# Patient Record
Sex: Male | Born: 1980
Health system: Southern US, Community
[De-identification: ages and names within clinical notes are randomized; demographics above are authoritative.]

## PROBLEM LIST (undated history)

## (undated) DIAGNOSIS — R85619 Unspecified abnormal cytological findings in specimens from anus: Secondary | ICD-10-CM

## (undated) DIAGNOSIS — J301 Allergic rhinitis due to pollen: Secondary | ICD-10-CM

## (undated) DIAGNOSIS — F1910 Other psychoactive substance abuse, uncomplicated: Secondary | ICD-10-CM

## (undated) DIAGNOSIS — F329 Major depressive disorder, single episode, unspecified: Secondary | ICD-10-CM

## (undated) DIAGNOSIS — B20 Human immunodeficiency virus [HIV] disease: Secondary | ICD-10-CM

## (undated) DIAGNOSIS — F32A Depression, unspecified: Secondary | ICD-10-CM

## (undated) HISTORY — DX: Human immunodeficiency virus (HIV) disease: B20

## (undated) HISTORY — PX: NO PAST SURGERIES: SHX2092

---

## 2012-02-18 ENCOUNTER — Emergency Department (HOSPITAL_COMMUNITY): Payer: Self-pay

## 2012-02-18 ENCOUNTER — Inpatient Hospital Stay (HOSPITAL_COMMUNITY)
Admission: EM | Admit: 2012-02-18 | Discharge: 2012-02-24 | DRG: 975 | Disposition: A | Discharge status: Home / Self Care | Payer: MEDICAID | Attending: Internal Medicine | Admitting: Internal Medicine

## 2012-02-18 ENCOUNTER — Encounter (HOSPITAL_COMMUNITY): Payer: Self-pay | Admitting: Emergency Medicine

## 2012-02-18 DIAGNOSIS — E871 Hypo-osmolality and hyponatremia: Secondary | ICD-10-CM | POA: Diagnosis present

## 2012-02-18 DIAGNOSIS — R634 Abnormal weight loss: Secondary | ICD-10-CM | POA: Diagnosis present

## 2012-02-18 DIAGNOSIS — B59 Pneumocystosis: Secondary | ICD-10-CM | POA: Diagnosis present

## 2012-02-18 DIAGNOSIS — D6489 Other specified anemias: Secondary | ICD-10-CM | POA: Diagnosis present

## 2012-02-18 DIAGNOSIS — F329 Major depressive disorder, single episode, unspecified: Secondary | ICD-10-CM | POA: Diagnosis present

## 2012-02-18 DIAGNOSIS — Z681 Body mass index (BMI) 19 or less, adult: Secondary | ICD-10-CM

## 2012-02-18 DIAGNOSIS — B370 Candidal stomatitis: Secondary | ICD-10-CM | POA: Diagnosis present

## 2012-02-18 DIAGNOSIS — F1211 Cannabis abuse, in remission: Secondary | ICD-10-CM | POA: Diagnosis present

## 2012-02-18 DIAGNOSIS — J189 Pneumonia, unspecified organism: Secondary | ICD-10-CM

## 2012-02-18 DIAGNOSIS — M25569 Pain in unspecified knee: Secondary | ICD-10-CM | POA: Diagnosis present

## 2012-02-18 DIAGNOSIS — R61 Generalized hyperhidrosis: Secondary | ICD-10-CM | POA: Diagnosis present

## 2012-02-18 DIAGNOSIS — F17200 Nicotine dependence, unspecified, uncomplicated: Secondary | ICD-10-CM | POA: Diagnosis present

## 2012-02-18 DIAGNOSIS — D649 Anemia, unspecified: Secondary | ICD-10-CM

## 2012-02-18 DIAGNOSIS — R509 Fever, unspecified: Secondary | ICD-10-CM

## 2012-02-18 DIAGNOSIS — R109 Unspecified abdominal pain: Secondary | ICD-10-CM | POA: Diagnosis present

## 2012-02-18 DIAGNOSIS — M25559 Pain in unspecified hip: Secondary | ICD-10-CM | POA: Diagnosis present

## 2012-02-18 DIAGNOSIS — F17210 Nicotine dependence, cigarettes, uncomplicated: Secondary | ICD-10-CM

## 2012-02-18 DIAGNOSIS — F1411 Cocaine abuse, in remission: Secondary | ICD-10-CM | POA: Diagnosis present

## 2012-02-18 DIAGNOSIS — Y9289 Other specified places as the place of occurrence of the external cause: Secondary | ICD-10-CM

## 2012-02-18 DIAGNOSIS — B20 Human immunodeficiency virus [HIV] disease: Principal | ICD-10-CM | POA: Diagnosis present

## 2012-02-18 DIAGNOSIS — W208XXA Other cause of strike by thrown, projected or falling object, initial encounter: Secondary | ICD-10-CM | POA: Diagnosis present

## 2012-02-18 DIAGNOSIS — L989 Disorder of the skin and subcutaneous tissue, unspecified: Secondary | ICD-10-CM | POA: Diagnosis present

## 2012-02-18 DIAGNOSIS — F3289 Other specified depressive episodes: Secondary | ICD-10-CM | POA: Diagnosis present

## 2012-02-18 HISTORY — DX: Depression, unspecified: F32.A

## 2012-02-18 HISTORY — DX: Allergic rhinitis due to pollen: J30.1

## 2012-02-18 HISTORY — DX: Major depressive disorder, single episode, unspecified: F32.9

## 2012-02-18 LAB — CBC WITH DIFFERENTIAL/PLATELET
Basophils Absolute: 0 10*3/uL (ref 0.0–0.1)
Eosinophils Relative: 1 % (ref 0–5)
Lymphs Abs: 0.7 10*3/uL (ref 0.7–4.0)
Monocytes Absolute: 1 10*3/uL (ref 0.1–1.0)
Monocytes Relative: 11 % (ref 3–12)
Neutrophils Relative %: 80 % — ABNORMAL HIGH (ref 43–77)
Platelets: 274 10*3/uL (ref 150–400)
RBC: 2.97 MIL/uL — ABNORMAL LOW (ref 4.22–5.81)
RDW: 16.4 % — ABNORMAL HIGH (ref 11.5–15.5)
WBC: 8.8 10*3/uL (ref 4.0–10.5)

## 2012-02-18 LAB — URINALYSIS, ROUTINE W REFLEX MICROSCOPIC
Bilirubin Urine: NEGATIVE
Glucose, UA: NEGATIVE mg/dL
Hgb urine dipstick: NEGATIVE
Specific Gravity, Urine: 1.016 (ref 1.005–1.030)

## 2012-02-18 LAB — COMPREHENSIVE METABOLIC PANEL
ALT: 13 U/L (ref 0–53)
AST: 20 U/L (ref 0–37)
Albumin: 2.7 g/dL — ABNORMAL LOW (ref 3.5–5.2)
CO2: 26 mEq/L (ref 19–32)
Calcium: 8.6 mg/dL (ref 8.4–10.5)
Chloride: 91 mEq/L — ABNORMAL LOW (ref 96–112)
Creatinine, Ser: 0.81 mg/dL (ref 0.50–1.35)
GFR calc non Af Amer: >90 mL/min (ref >90)
Sodium: 125 mEq/L — ABNORMAL LOW (ref 135–145)

## 2012-02-18 MED ORDER — ZOLPIDEM TARTRATE 5 MG PO TABS
5.0000 mg | ORAL_TABLET | Freq: Every evening | ORAL | Status: DC | PRN
  Administered 2012-02-19 – 2012-02-22 (×5): 5 mg via ORAL
  Filled 2012-02-18 (×5): qty 1

## 2012-02-18 MED ORDER — DEXTROSE 5 % IV SOLN
500.0000 mg | INTRAVENOUS | Status: DC
  Administered 2012-02-19 – 2012-02-22 (×4): 500 mg via INTRAVENOUS
  Filled 2012-02-18 (×7): qty 500

## 2012-02-18 MED ORDER — ALUM & MAG HYDROXIDE-SIMETH 200-200-20 MG/5ML PO SUSP
30.0000 mL | Freq: Four times a day (QID) | ORAL | Status: DC | PRN
  Administered 2012-02-22: 30 mL via ORAL
  Filled 2012-02-18: qty 30

## 2012-02-18 MED ORDER — DEXTROSE 5 % IV SOLN
1.0000 g | Freq: Once | INTRAVENOUS | Status: AC
  Administered 2012-02-18: 1 g via INTRAVENOUS
  Filled 2012-02-18: qty 10

## 2012-02-18 MED ORDER — ALBUTEROL SULFATE (5 MG/ML) 0.5% IN NEBU
2.5000 mg | INHALATION_SOLUTION | RESPIRATORY_TRACT | Status: DC | PRN
  Administered 2012-02-21: 2.5 mg via RESPIRATORY_TRACT
  Filled 2012-02-18: qty 0.5

## 2012-02-18 MED ORDER — SODIUM CHLORIDE 0.9 % IV SOLN
INTRAVENOUS | Status: DC
  Administered 2012-02-19 (×2): via INTRAVENOUS
  Administered 2012-02-20: 75 mL/h via INTRAVENOUS
  Administered 2012-02-22: 15:00:00 via INTRAVENOUS

## 2012-02-18 MED ORDER — ENOXAPARIN SODIUM 40 MG/0.4ML ~~LOC~~ SOLN
40.0000 mg | SUBCUTANEOUS | Status: DC
  Administered 2012-02-19 – 2012-02-21 (×2): 40 mg via SUBCUTANEOUS
  Filled 2012-02-18 (×7): qty 0.4

## 2012-02-18 MED ORDER — OXYCODONE HCL 5 MG PO TABS
5.0000 mg | ORAL_TABLET | ORAL | Status: DC | PRN
  Administered 2012-02-19: 5 mg via ORAL
  Filled 2012-02-18: qty 1

## 2012-02-18 MED ORDER — ONDANSETRON HCL 4 MG/2ML IJ SOLN: 4.0000 mg | Freq: Four times a day (QID) | INTRAMUSCULAR | Status: DC | PRN

## 2012-02-18 MED ORDER — ONDANSETRON HCL 4 MG PO TABS: 4.0000 mg | ORAL_TABLET | Freq: Four times a day (QID) | ORAL | Status: DC | PRN

## 2012-02-18 MED ORDER — ACETAMINOPHEN 325 MG PO TABS
650.0000 mg | ORAL_TABLET | Freq: Four times a day (QID) | ORAL | Status: DC | PRN
  Administered 2012-02-21: 650 mg via ORAL

## 2012-02-18 MED ORDER — ACETAMINOPHEN 650 MG RE SUPP: 650.0000 mg | Freq: Four times a day (QID) | RECTAL | Status: DC | PRN

## 2012-02-18 MED ORDER — ONDANSETRON HCL 4 MG/2ML IJ SOLN
4.0000 mg | Freq: Once | INTRAMUSCULAR | Status: AC
  Administered 2012-02-18: 4 mg via INTRAVENOUS
  Filled 2012-02-18: qty 2

## 2012-02-18 MED ORDER — DEXTROSE 5 % IV SOLN
500.0000 mg | Freq: Once | INTRAVENOUS | Status: AC
  Administered 2012-02-18: 500 mg via INTRAVENOUS
  Filled 2012-02-18 (×2): qty 500

## 2012-02-18 MED ORDER — HYDROMORPHONE HCL PF 1 MG/ML IJ SOLN
1.0000 mg | Freq: Once | INTRAMUSCULAR | Status: AC
  Administered 2012-02-18: 1 mg via INTRAVENOUS
  Filled 2012-02-18: qty 1

## 2012-02-18 MED ORDER — ALBUTEROL SULFATE (5 MG/ML) 0.5% IN NEBU
2.5000 mg | INHALATION_SOLUTION | Freq: Four times a day (QID) | RESPIRATORY_TRACT | Status: DC
  Administered 2012-02-19 (×4): 2.5 mg via RESPIRATORY_TRACT
  Filled 2012-02-18 (×4): qty 0.5

## 2012-02-18 MED ORDER — HYDROMORPHONE HCL PF 1 MG/ML IJ SOLN
0.5000 mg | INTRAMUSCULAR | Status: DC | PRN
  Administered 2012-02-19: 0.5 mg via INTRAVENOUS
  Administered 2012-02-21: 1 mg via INTRAVENOUS
  Filled 2012-02-18 (×2): qty 1

## 2012-02-18 MED ORDER — DEXTROSE 5 % IV SOLN
1.0000 g | INTRAVENOUS | Status: DC
  Administered 2012-02-19 – 2012-02-23 (×5): 1 g via INTRAVENOUS
  Filled 2012-02-18 (×6): qty 10

## 2012-02-18 MED ORDER — ACETAMINOPHEN 325 MG PO TABS
650.0000 mg | ORAL_TABLET | Freq: Four times a day (QID) | ORAL | Status: DC | PRN
  Administered 2012-02-18 – 2012-02-20 (×3): 650 mg via ORAL
  Filled 2012-02-18 (×4): qty 2

## 2012-02-18 MED ORDER — IBUPROFEN 400 MG PO TABS
600.0000 mg | ORAL_TABLET | Freq: Once | ORAL | Status: AC
  Administered 2012-02-18: 600 mg via ORAL
  Filled 2012-02-18 (×2): qty 1

## 2012-02-18 NOTE — ED Notes (Signed)
Pt has interpreter at bedside; reports that pt had injury at work on Monday, c/o R knee, hip, and L shoulder/rib pain with movement; pt also reports having a fever but unsure why, reports SOB but states it started after injury

## 2012-02-18 NOTE — H&P (Signed)
[facility] History and Physical  Herbert Ellison ZOX:[id number] DOB: 1980-05-23 DOA: 02/18/2012  Referring physician:  PCP: No primary provider on file.  Specialists:   Chief Complaint: Fevers and Cough  HPI: Herbert Ellison is a 30 y.o. male from [place] who has been brought to [facility] by friends of his family after the loss of his mother.  He was brought to the ED due to fevers chills and cough for the past 4 days.  He has had fevers to 103.  He speaks very little [place], and his family friends are at the bedside and interpret.    In the ED he was evaluated by the EDP, and a chest x-Ray was done which revealed bilateral pneumonia.  A rapid HIV test was also ordered.  He was placed on IV Rocephin and Azithromycin to cover Community Acquired Pneumonia and referred for medical admission.      Review of Systems: The patient denies anorexia, fever, weight loss, vision loss, decreased hearing, hoarseness, chest pain, syncope, dyspnea on exertion, peripheral edema, balance deficits, hemoptysis, abdominal pain, melena, hematochezia, severe indigestion/heartburn, hematuria, incontinence, genital sores, muscle weakness, suspicious skin lesions, transient blindness, difficulty walking, depression, unusual weight change, abnormal bleeding, enlarged lymph nodes, angioedema, and breast masses.    Past Medical History  Diagnosis Date  . Hay fever     when child  . Depression      History reviewed. No pertinent past surgical history.     Medications:  HOME MEDS: Prior to Admission medications   Medication Sig Start Date End Date Taking? Authorizing Provider  acetaminophen (TYLENOL) 500 MG tablet Take 1,000 mg by mouth every 6 (six) hours as needed. For pain   Yes Historical Provider, MD    Allergies:  No Known Allergies  Social History:   reports that he has been smoking Cigarettes.  He has been smoking about 1 pack per day. He does not have any smokeless  tobacco history on file. He reports that he does not drink alcohol or use illicit drugs.   Family History:          Mother deceased of CHF       Maternal Uncle with Diabetes   Physical Exam:  GEN:  Pleasant 31 year old thin Hispanic Male examined  and in no acute distress; cooperative with exam Filed Vitals:   02/18/12 1929 02/18/12 2129 02/18/12 2130 02/18/12 2253  BP: 102/58 91/50 103/65   Pulse: 141 133 115   Temp: 103 F (39.4 C) 102.5 F (39.2 C)  99 F (37.2 C)  TempSrc: Oral Oral  Oral  Resp: 24 26 22    SpO2: 97% 98% 100%    Blood pressure 103/65, pulse 115, temperature 99 F (37.2 C), temperature source Oral, resp. rate 22, SpO2 100.00%. PSYCH: He is alert and oriented x4; does not appear anxious does not appear depressed; affect is normal HEENT: Normocephalic and Atraumatic, Mucous membranes pink; PERRLA; EOM intact; Fundi:  Benign;  No scleral icterus, Nares: Patent, Oropharynx: + white tongue exudate, Fair Dentition, Neck:  FROM, no cervical lymphadenopathy nor thyromegaly or carotid bruit; no JVD; Breasts:: Not examined CHEST WALL: No tenderness CHEST: Normal respiration, clear to auscultation bilaterally HEART: Regular rate and rhythm; no murmurs rubs or gallops BACK: No kyphosis or scoliosis; no CVA tenderness ABDOMEN: Positive Bowel Sounds, Scaphoid, soft non-tender; no masses, no organomegaly.   Rectal Exam: Not done EXTREMITIES: No bone or joint deformity; age-appropriate arthropathy of the hands and knees; no cyanosis, clubbing or  edema; no ulcerations. Genitalia: not examined PULSES: 2+ and symmetric SKIN: Diffuse scaling and papules on extremities.   CNS: Cranial nerves 2-12 grossly intact no focal neurologic deficit    Labs on Admission:  Basic Metabolic Panel:  Lab 02/18/12 4098  NA 125*  K 4.0  CL 91*  CO2 26  GLUCOSE 106*  BUN 8  CREATININE 0.81  CALCIUM 8.6  MG --  PHOS --   Liver Function Tests:  Lab 02/18/12 1959  AST 20  ALT 13    ALKPHOS 49  BILITOT 0.2*  PROT 8.4*  ALBUMIN 2.7*   No results found for this basename: LIPASE:5,AMYLASE:5 in the last 168 hours No results found for this basename: AMMONIA:5 in the last 168 hours CBC:  Lab 02/18/12 1959  WBC 8.8  NEUTROABS 7.0  HGB 8.0*  HCT 24.5*  MCV 82.5  PLT 274   Cardiac Enzymes: No results found for this basename: CKTOTAL:5,CKMB:5,CKMBINDEX:5,TROPONINI:5 in the last 168 hours  BNP (last 3 results) No results found for this basename: PROBNP:3 in the last 8760 hours CBG: No results found for this basename: GLUCAP:5 in the last 168 hours  Radiological Exams on Admission: Dg Chest 2 View  02/18/2012  *RADIOLOGY REPORT*  Clinical Data: Fever and chills.  CHEST - 2 VIEW  Comparison: None.  Findings: There is evidence of multifocal pneumonia with discrete infiltrates present in the right upper lobe and the lingula.  There may also be involvement of the left lower lobe.  There is an associated small right pleural effusion.  No pulmonary edema.  The heart size is normal.  IMPRESSION: Bilateral pneumonia in the right upper lobe and lingula.  There may also be infiltrate in the left lower lobe.  Associated small right pleural effusion is present.   Original Report Authenticated By: Irish Lack, M.D.    Dg Hip Complete Right  02/18/2012  *RADIOLOGY REPORT*  Clinical Data: Pain post blunt trauma.  RIGHT HIP - COMPLETE 2+ VIEW  Comparison: None.  Findings: Negative for fracture, dislocation, or other acute abnormality.  Normal alignment and mineralization. No significant degenerative change.  Regional soft tissues unremarkable.  IMPRESSION:  Negative   Original Report Authenticated By: D. Andria Rhein, MD    Dg Knee Complete 4 Views Right  02/18/2012  *RADIOLOGY REPORT*  Clinical Data: Pain post blunt trauma  RIGHT KNEE - COMPLETE 4+ VIEW  Comparison: None.  Findings: No effusion. Negative for fracture, dislocation, or other acute abnormality.  Normal alignment and  mineralization. No significant degenerative change.  Regional soft tissues unremarkable.  IMPRESSION:  Negative   Original Report Authenticated By: D. Andria Rhein, MD     EKG: Independently reviewed.   Assessment: Principal Problem:  *Community acquired pneumonia Active Problems:  Hyponatremia  Anemia  Fever  Oral candidiasis New Diagnosis of HIV     Plan: Admit to Med/Surg Bed IV Rocephin and Azithromycin Albuterol Nebulizer treatments PRN O2 IV Fluconazole  DVT prophylaxis Addendum:  The Rapid HIV Test Returned Reactive,  And Patient was placed on IV Bactrim to cover PCP pneumonia, and a CD4 count was ordered Patient Needs an Infectious Diseases Consultation in the AM.      Code Status: FULL CODE Family Communication: His Uncle's Mother and Uncle's Brother are at Bedside Disposition Plan: Return to Home  Time spent:60 Minutes Ron Parker [facility] Pager [phone]  If 7PM-7AM, please contact night-coverage [email or web address] Password [facility] 02/18/2012, 11:16 PM

## 2012-02-18 NOTE — ED Provider Notes (Signed)
History     CSN: [id number]  Arrival date & time 02/18/12  1918   First MD Initiated Contact with Patient 02/18/12 2059      Chief Complaint  Patient presents with  . Fever  . Injury    (Consider location/radiation/quality/duration/timing/severity/associated sxs/prior treatment) Patient is a 31 y.o. male presenting with fever and injury. The history is provided by the patient.  Fever Primary symptoms of the febrile illness include fever, cough, shortness of breath, nausea, vomiting and myalgias. Primary symptoms do not include wheezing, abdominal pain, diarrhea or dysuria. The current episode started 3 to 5 days ago. This is a new problem. The problem has been rapidly worsening.  The maximum temperature recorded prior to his arrival was 103 to 104 F. The temperature was taken by an oral thermometer.  Risk factors for febrile illness include implanted device. Injury  Episode onset: 4 days ago while at work. The incident occurred at work. Injury mechanism: was lifting a heavy couch and it fell on his knee. The injury was related to work. There is an injury to the right hip and right knee. The pain is severe. Associated symptoms include nausea, vomiting and cough. Pertinent negatives include no abdominal pain. He has received no recent medical care.    Past Medical History  Diagnosis Date  . Hay fever     when child  . Depression     History reviewed. No pertinent past surgical history.  History reviewed. No pertinent family history.  History  Substance Use Topics  . Smoking status: Current Every Day Smoker -- 1.0 packs/day    Types: Cigarettes  . Smokeless tobacco: Not on file  . Alcohol Use: No      Review of Systems  Constitutional: Positive for fever.  Respiratory: Positive for cough and shortness of breath. Negative for wheezing.   Gastrointestinal: Positive for nausea and vomiting. Negative for abdominal pain and diarrhea.  Genitourinary: Negative for dysuria.    Musculoskeletal: Positive for myalgias.  All other systems reviewed and are negative.    Allergies  Review of patient's allergies indicates no known allergies.  Home Medications   Current Outpatient Rx  Name  Route  Sig  Dispense  Refill  . ACETAMINOPHEN 500 MG PO TABS   Oral   Take 1,000 mg by mouth every 6 (six) hours as needed. For pain           BP 103/65  Pulse 115  Temp 102.5 F (39.2 C) (Oral)  Resp 22  SpO2 100%  Physical Exam  Nursing note and vitals reviewed. Constitutional: He is oriented to person, place, and time. He appears well-developed and well-nourished. No distress.  HENT:  Head: Normocephalic and atraumatic.  Mouth/Throat: Oropharynx is clear and moist.  Eyes: Conjunctivae normal and EOM are normal. Pupils are equal, round, and reactive to light.  Neck: Normal range of motion. Neck supple.  Cardiovascular: Regular rhythm and intact distal pulses.  Tachycardia present.   No murmur heard. Pulmonary/Chest: Tachypnea noted. No respiratory distress. He has no wheezes. He has rhonchi. He has no rales. He exhibits tenderness.  Abdominal: Soft. He exhibits no distension. There is no tenderness. There is no rebound and no guarding. A hernia is present. Hernia confirmed negative in the right inguinal area and confirmed negative in the left inguinal area.  Musculoskeletal: Normal range of motion. He exhibits no edema and no tenderness.       Right hip: He exhibits bony tenderness.  Right knee: He exhibits normal range of motion and no swelling. tenderness found. Medial joint line and lateral joint line tenderness noted.  Neurological: He is alert and oriented to person, place, and time.  Skin: Skin is warm and dry. No rash noted. No erythema.  Psychiatric: He has a normal mood and affect. His behavior is normal.    ED Course  Procedures (including critical care time)  Labs Reviewed  CBC WITH DIFFERENTIAL - Abnormal; Notable for the following:    RBC  2.97 (*)     Hemoglobin 8.0 (*)     HCT 24.5 (*)     RDW 16.4 (*)     Neutrophils Relative 80 (*)     Lymphocytes Relative 8 (*)     All other components within normal limits  COMPREHENSIVE METABOLIC PANEL - Abnormal; Notable for the following:    Sodium 125 (*)     Chloride 91 (*)     Glucose, Bld 106 (*)     Total Protein 8.4 (*)     Albumin 2.7 (*)     Total Bilirubin 0.2 (*)     All other components within normal limits  URINALYSIS, ROUTINE W REFLEX MICROSCOPIC - Abnormal; Notable for the following:    Protein, ur 100 (*)     All other components within normal limits  URINE MICROSCOPIC-ADD ON  URINE CULTURE  RAPID HIV SCREEN ([facility])  HEPATIC FUNCTION PANEL  LEGIONELLA ANTIGEN, URINE   Dg Chest 2 View  02/18/2012  *RADIOLOGY REPORT*  Clinical Data: Fever and chills.  CHEST - 2 VIEW  Comparison: None.  Findings: There is evidence of multifocal pneumonia with discrete infiltrates present in the right upper lobe and the lingula.  There may also be involvement of the left lower lobe.  There is an associated small right pleural effusion.  No pulmonary edema.  The heart size is normal.  IMPRESSION: Bilateral pneumonia in the right upper lobe and lingula.  There may also be infiltrate in the left lower lobe.  Associated small right pleural effusion is present.   Original Report Authenticated By: Irish Lack, M.D.      No diagnosis found.    MDM   Patient with infectious symptoms for the last 4 days. Chest pain, shortness of breath, fever, cough. His chest x-ray significant for bilateral patchy pneumonia. Also patient has a hemoglobin of 8 and a sodium of 125. He is only 31 years old and has no prior medical history. Concern for possible HIV versus legionnaires disease or other underlying etiology as the cause of his abnormal labs. Patient was given IV fluids, antipyretics, Rocephin and azithromycin. He will be admitted for further evaluation.  Also he continues to state that  his knee right knee and hip her to do to an accident at work. These areas were x-rayed.        Gwyneth Sprout, MD 02/18/12 2235

## 2012-02-19 ENCOUNTER — Encounter (HOSPITAL_COMMUNITY): Payer: Self-pay | Admitting: *Deleted

## 2012-02-19 ENCOUNTER — Observation Stay (HOSPITAL_COMMUNITY): Payer: Self-pay

## 2012-02-19 DIAGNOSIS — F17210 Nicotine dependence, cigarettes, uncomplicated: Secondary | ICD-10-CM | POA: Diagnosis present

## 2012-02-19 DIAGNOSIS — J189 Pneumonia, unspecified organism: Secondary | ICD-10-CM

## 2012-02-19 LAB — CBC
Hemoglobin: 8.5 g/dL — ABNORMAL LOW (ref 13.0–17.0)
MCH: 26.6 pg (ref 26.0–34.0)
RBC: 3.19 MIL/uL — ABNORMAL LOW (ref 4.22–5.81)
WBC: 7 10*3/uL (ref 4.0–10.5)

## 2012-02-19 LAB — HEPATIC FUNCTION PANEL
ALT: 12 U/L (ref 0–53)
Alkaline Phosphatase: 42 U/L (ref 39–117)
Bilirubin, Direct: <0.1 mg/dL (ref 0.0–0.3)
Total Bilirubin: 0.2 mg/dL — ABNORMAL LOW (ref 0.3–1.2)

## 2012-02-19 LAB — BASIC METABOLIC PANEL
CO2: 24 mEq/L (ref 19–32)
GFR calc non Af Amer: >90 mL/min (ref >90)
Glucose, Bld: 115 mg/dL — ABNORMAL HIGH (ref 70–99)
Potassium: 3.3 mEq/L — ABNORMAL LOW (ref 3.5–5.1)
Sodium: 134 mEq/L — ABNORMAL LOW (ref 135–145)

## 2012-02-19 LAB — RAPID URINE DRUG SCREEN, HOSP PERFORMED
Amphetamines: NOT DETECTED
Opiates: NOT DETECTED

## 2012-02-19 MED ORDER — WHITE PETROLATUM GEL
Status: AC
  Administered 2012-02-19: 19:00:00
  Filled 2012-02-19: qty 5

## 2012-02-19 MED ORDER — BIOTENE DRY MOUTH MT LIQD
15.0000 mL | Freq: Two times a day (BID) | OROMUCOSAL | Status: DC
  Administered 2012-02-19 – 2012-02-24 (×10): 15 mL via OROMUCOSAL

## 2012-02-19 MED ORDER — OXYMETAZOLINE HCL 0.05 % NA SOLN: 1.0000 | Freq: Two times a day (BID) | NASAL | Status: DC

## 2012-02-19 MED ORDER — OXYMETAZOLINE HCL 0.05 % NA SOLN
1.0000 | Freq: Two times a day (BID) | NASAL | Status: DC | PRN
  Administered 2012-02-19: 1 via NASAL
  Filled 2012-02-19: qty 15

## 2012-02-19 MED ORDER — SULFAMETHOXAZOLE-TRIMETHOPRIM 400-80 MG/5ML IV SOLN
250.0000 mg | INTRAVENOUS | Status: AC
  Administered 2012-02-19: 250 mg via INTRAVENOUS
  Filled 2012-02-19: qty 15.6

## 2012-02-19 MED ORDER — OXYCODONE-ACETAMINOPHEN 5-325 MG PO TABS
1.0000 | ORAL_TABLET | Freq: Four times a day (QID) | ORAL | Status: DC | PRN
  Administered 2012-02-19: 1 via ORAL
  Filled 2012-02-19: qty 1

## 2012-02-19 MED ORDER — NICOTINE 14 MG/24HR TD PT24
14.0000 mg | MEDICATED_PATCH | TRANSDERMAL | Status: DC
  Administered 2012-02-19 – 2012-02-24 (×6): 14 mg via TRANSDERMAL
  Filled 2012-02-19 (×6): qty 1

## 2012-02-19 MED ORDER — SULFAMETHOXAZOLE-TRIMETHOPRIM 400-80 MG/5ML IV SOLN
250.0000 mg | Freq: Three times a day (TID) | INTRAVENOUS | Status: DC
  Administered 2012-02-19 – 2012-02-23 (×12): 250 mg via INTRAVENOUS
  Filled 2012-02-19 (×27): qty 15.6

## 2012-02-19 MED ORDER — FLUCONAZOLE 100MG IVPB
100.0000 mg | INTRAVENOUS | Status: DC
  Administered 2012-02-19 – 2012-02-20 (×2): 100 mg via INTRAVENOUS
  Filled 2012-02-19 (×3): qty 50

## 2012-02-19 MED ORDER — FLUCONAZOLE IN SODIUM CHLORIDE 200-0.9 MG/100ML-% IV SOLN
200.0000 mg | Freq: Once | INTRAVENOUS | Status: AC
  Administered 2012-02-19: 200 mg via INTRAVENOUS
  Filled 2012-02-19: qty 100

## 2012-02-19 MED ORDER — SODIUM CHLORIDE 0.9 % IV BOLUS (SEPSIS)
1000.0000 mL | Freq: Once | INTRAVENOUS | Status: AC
  Administered 2012-02-19: 1000 mL via INTRAVENOUS

## 2012-02-19 MED ORDER — OXYCODONE-ACETAMINOPHEN 5-325 MG PO TABS
1.0000 | ORAL_TABLET | Freq: Four times a day (QID) | ORAL | Status: DC | PRN
  Administered 2012-02-19: 2 via ORAL
  Administered 2012-02-21 – 2012-02-22 (×2): 1 via ORAL
  Administered 2012-02-22: 2 via ORAL
  Administered 2012-02-22: 1 via ORAL
  Administered 2012-02-23 (×3): 2 via ORAL
  Filled 2012-02-19 (×3): qty 2
  Filled 2012-02-19: qty 1
  Filled 2012-02-19 (×2): qty 2
  Filled 2012-02-19 (×2): qty 1

## 2012-02-19 NOTE — Progress Notes (Signed)
[facility]             Progress Note   Subjective: Seen with NT who help as interpreter, reports feeling a little better than yesterday, breathing improving, having chills. Moved from [place] in Early November, lives with Herbert Ellison and family, had an accident where a cough fell on his R knee and hip on Monday and that night started having cough, shortness of breath and pleuritic chest pain  Objective: Vital signs in last 24 hours: Temp:  [97.2 F (36.2 C)-103 F (39.4 C)] 97.5 F (36.4 C) (11/23 0851) Pulse Rate:  [82-141] 92  (11/23 0851) Resp:  [18-26] 18  (11/23 0851) BP: (77-103)/(42-65) 85/49 mmHg (11/23 0851) SpO2:  [96 %-100 %] 98 % (11/23 0736) Weight:  [49.7 kg (109 lb 9.1 oz)] 49.7 kg (109 lb 9.1 oz) (11/23 0020) Weight change:  Last BM Date: 02/18/12  Intake/Output from previous day: 11/22 0701 - 11/23 0700 In: 1574 [P.O.:420; I.V.:538.3; IV Piggyback:615.6] Out: 1900 [Urine:1900] Total I/O In: 1050 [IV Piggyback:1050] Out: 1000 [Urine:1000]   Physical Exam: General: Alert, awake, oriented x3, in no acute distress. HEENT: No bruits, no goiter, whitish exudates on tongue Heart: Regular rate and rhythm, without murmurs, rubs, gallops. Lungs: scattered ronchi Abdomen: Soft, nontender, nondistended, positive bowel sounds. Extremities: No clubbing cyanosis or edema with positive pedal pulses. Neuro: Grossly intact, nonfocal. Skin: diffuse scaling and hypopigmented plaques all over    Lab Results: Basic Metabolic Panel:  [facility] 02/19/12 0605 02/18/12 1959  NA 134* 125*  K 3.3* 4.0  CL 99 91*  CO2 24 26  GLUCOSE 115* 106*  BUN 8 8  CREATININE 0.63 0.81  CALCIUM 8.3* 8.6  MG -- --  PHOS -- --   Liver Function Tests:  [facility] 02/18/12 2307 02/18/12 1959  AST 17 20  ALT 12 13  ALKPHOS 42 49  BILITOT 0.2* 0.2*  PROT 7.3 8.4*  ALBUMIN 2.3* 2.7*   No results found for this basename: LIPASE:2,AMYLASE:2 in the last 72 hours No  results found for this basename: AMMONIA:2 in the last 72 hours CBC:  Basename 02/19/12 0605 02/18/12 1959  WBC 7.0 8.8  NEUTROABS -- 7.0  HGB 8.5* 8.0*  HCT 26.5* 24.5*  MCV 83.1 82.5  PLT 228 274   Cardiac Enzymes: No results found for this basename: CKTOTAL:3,CKMB:3,CKMBINDEX:3,TROPONINI:3 in the last 72 hours BNP: No results found for this basename: PROBNP:3 in the last 72 hours D-Dimer: No results found for this basename: DDIMER:2 in the last 72 hours CBG: No results found for this basename: GLUCAP:6 in the last 72 hours Hemoglobin A1C: No results found for this basename: HGBA1C in the last 72 hours Fasting Lipid Panel: No results found for this basename: CHOL,HDL,LDLCALC,TRIG,CHOLHDL,LDLDIRECT in the last 72 hours Thyroid Function Tests: No results found for this basename: TSH,T4TOTAL,FREET4,T3FREE,THYROIDAB in the last 72 hours Anemia Panel: No results found for this basename: VITAMINB12,FOLATE,FERRITIN,TIBC,IRON,RETICCTPCT in the last 72 hours Coagulation: No results found for this basename: LABPROT:2,INR:2 in the last 72 hours Urine Drug Screen: Drugs of Abuse     Component Value Date/Time   LABOPIA NONE DETECTED 02/19/2012 0907   COCAINSCRNUR NONE DETECTED 02/19/2012 0907   LABBENZ NONE DETECTED 02/19/2012 0907   AMPHETMU NONE DETECTED 02/19/2012 0907   THCU POSITIVE* 02/19/2012 0907   LABBARB NONE DETECTED 02/19/2012 0907    Alcohol Level: No results found for this basename: ETH:2 in the last 72 hours Urinalysis:  Basename 02/18/12 1956  COLORURINE YELLOW  LABSPEC 1.016  PHURINE 6.5  GLUCOSEU NEGATIVE  HGBUR NEGATIVE  BILIRUBINUR NEGATIVE  KETONESUR NEGATIVE  PROTEINUR 100*  UROBILINOGEN 1.0  NITRITE NEGATIVE  LEUKOCYTESUR NEGATIVE    No results found for this or any previous visit (from the past 240 hour(s)).  Studies/Results: Dg Chest 2 View  02/18/2012  *RADIOLOGY REPORT*  Clinical Data: Fever and chills.  CHEST - 2 VIEW  Comparison: None.   Findings: There is evidence of multifocal pneumonia with discrete infiltrates present in the right upper lobe and the lingula.  There may also be involvement of the left lower lobe.  There is an associated small right pleural effusion.  No pulmonary edema.  The heart size is normal.  IMPRESSION: Bilateral pneumonia in the right upper lobe and lingula.  There may also be infiltrate in the left lower lobe.  Associated small right pleural effusion is present.   Original Report Authenticated By: Irish Lack, M.D.    Dg Hip Complete Right  02/18/2012  *RADIOLOGY REPORT*  Clinical Data: Pain post blunt trauma.  RIGHT HIP - COMPLETE 2+ VIEW  Comparison: None.  Findings: Negative for fracture, dislocation, or other acute abnormality.  Normal alignment and mineralization. No significant degenerative change.  Regional soft tissues unremarkable.  IMPRESSION:  Negative   Original Report Authenticated By: D. Andria Rhein, MD    Ct Head Wo Contrast  02/19/2012  *RADIOLOGY REPORT*  Clinical Data:  Injury from fall with frontal headache and posterior neck pain.  CT HEAD WITHOUT CONTRAST CT CERVICAL SPINE WITHOUT CONTRAST  Technique:  Multidetector CT imaging of the head and cervical spine was performed following the standard protocol without intravenous contrast.  Multiplanar CT image reconstructions of the cervical spine were also generated.  Comparison:   None  CT HEAD  Findings: Technically limited study due to streak artifact likely from motion. The ventricles and sulci are symmetrical without significant effacement, displacement, or dilatation. No mass effect or midline shift. No abnormal extra-axial fluid collections. The grey-white matter junction is distinct. Basal cisterns are not effaced. No acute intracranial hemorrhage. No depressed skull fractures.  Opacification of right ethmoid air cells, likely inflammatory.  Remaining paranasal sinuses and mastoid air cells are not opacified.  IMPRESSION: No acute  intracranial abnormalities.  CT CERVICAL SPINE  Findings: Normal alignment of the cervical vertebrae and facet joints.  Lateral masses of C1 are symmetrical.  The odontoid process appears intact.  No vertebral compression deformities. Intervertebral disc space heights are preserved.  No prevertebral soft tissue swelling.  No focal bone lesion or bone destruction. Bone cortex and trabecular architecture appear intact.  IMPRESSION: No displaced fractures identified.   Original Report Authenticated By: Burman Nieves, M.D.    Ct Cervical Spine Wo Contrast  02/19/2012  *RADIOLOGY REPORT*  Clinical Data:  Injury from fall with frontal headache and posterior neck pain.  CT HEAD WITHOUT CONTRAST CT CERVICAL SPINE WITHOUT CONTRAST  Technique:  Multidetector CT imaging of the head and cervical spine was performed following the standard protocol without intravenous contrast.  Multiplanar CT image reconstructions of the cervical spine were also generated.  Comparison:   None  CT HEAD  Findings: Technically limited study due to streak artifact likely from motion. The ventricles and sulci are symmetrical without significant effacement, displacement, or dilatation. No mass effect or midline shift. No abnormal extra-axial fluid collections. The grey-white matter junction is distinct. Basal cisterns are not effaced. No acute intracranial hemorrhage. No depressed skull fractures.  Opacification of right ethmoid air cells, likely inflammatory.  Remaining paranasal sinuses and mastoid air cells are not opacified.  IMPRESSION: No acute intracranial abnormalities.  CT CERVICAL SPINE  Findings: Normal alignment of the cervical vertebrae and facet joints.  Lateral masses of C1 are symmetrical.  The odontoid process appears intact.  No vertebral compression deformities. Intervertebral disc space heights are preserved.  No prevertebral soft tissue swelling.  No focal bone lesion or bone destruction. Bone cortex and trabecular  architecture appear intact.  IMPRESSION: No displaced fractures identified.   Original Report Authenticated By: Burman Nieves, M.D.    Dg Knee Complete 4 Views Right  02/18/2012  *RADIOLOGY REPORT*  Clinical Data: Pain post blunt trauma  RIGHT KNEE - COMPLETE 4+ VIEW  Comparison: None.  Findings: No effusion. Negative for fracture, dislocation, or other acute abnormality.  Normal alignment and mineralization. No significant degenerative change.  Regional soft tissues unremarkable.  IMPRESSION:  Negative   Original Report Authenticated By: D. Andria Rhein, MD     Medications: Scheduled Meds:   . albuterol  2.5 mg Nebulization Q6H  . antiseptic oral rinse  15 mL Mouth Rinse BID  . [COMPLETED] azithromycin  500 mg Intravenous Once  . azithromycin  500 mg Intravenous Q24H  . [COMPLETED] cefTRIAXone (ROCEPHIN)  IV  1 g Intravenous Once  . cefTRIAXone (ROCEPHIN)  IV  1 g Intravenous Q24H  . enoxaparin (LOVENOX) injection  40 mg Subcutaneous Q24H  . fluconazole (DIFLUCAN) IV  100 mg Intravenous Q24H  . [COMPLETED] fluconazole (DIFLUCAN) IV  200 mg Intravenous Once  . [COMPLETED]  HYDROmorphone (DILAUDID) injection  1 mg Intravenous Once  . [COMPLETED] ibuprofen  600 mg Oral Once  . [COMPLETED] ondansetron  4 mg Intravenous Once  . [COMPLETED] sodium chloride  1,000 mL Intravenous Once  . [COMPLETED] sulfamethoxazole-trimethoprim  250 mg Intravenous NOW  . sulfamethoxazole-trimethoprim  250 mg Intravenous Q8H   Continuous Infusions:   . sodium chloride 100 mL/hr at 02/19/12 0853   PRN Meds:.acetaminophen, acetaminophen, acetaminophen, albuterol, alum & mag hydroxide-simeth, HYDROmorphone (DILAUDID) injection, ondansetron (ZOFRAN) IV, ondansetron, oxyCODONE, oxyCODONE-acetaminophen, zolpidem  Assessment/Plan:  1. Community acquired pneumonia Concern for PCP pneumonia Continue Bactrim, Will check pneumocystis DFA Also continue ceftriaxone and azithromycin Will request ID consult  2.  HIV screen positive Await Western Blot, I have not discussed this with patient yet  3. Anemia: check anemia panel  DVT proph: lovenox Family communication: discussed with patient at bedside Disposition: inpatient    Time spent coordinating care:   LOS: 1 day   [facility] Pager: [phone] 02/19/2012, 10:35 AM

## 2012-02-19 NOTE — Progress Notes (Signed)
ANTIBIOTIC CONSULT NOTE - INITIAL  Pharmacy Consult for Septra and Diflucan Indication: r/o PCP PNA and candidiasis  No Known Allergies  Patient Measurements: Height: 5\' 5"  (165.1 cm) Weight: 109 lb 9.1 oz (49.7 kg) IBW/kg (Calculated) : 61.5   Vital Signs: Temp: 97.9 F (36.6 C) (11/23 0020) Temp src: Oral (11/23 0020) BP: 99/50 mmHg (11/23 0020) Pulse Rate: 93  (11/23 0020)  Labs:  [facility] 02/18/12 1959  WBC 8.8  HGB 8.0*  PLT 274  LABCREA --  CREATININE 0.81   Estimated Creatinine Clearance: 91.2 ml/min (by C-G formula based on Cr of 0.81).  Microbiology: No results found for this or any previous visit (from the past 720 hour(s)).  Medical History: Past Medical History  Diagnosis Date  . Hay fever     when child  . Depression     Medications:  Prescriptions prior to admission  Medication Sig Dispense Refill  . acetaminophen (TYLENOL) 500 MG tablet Take 1,000 mg by mouth every 6 (six) hours as needed. For pain       Scheduled:    . albuterol  2.5 mg Nebulization Q6H  . [COMPLETED] azithromycin  500 mg Intravenous Once  . azithromycin  500 mg Intravenous Q24H  . [COMPLETED] cefTRIAXone (ROCEPHIN)  IV  1 g Intravenous Once  . cefTRIAXone (ROCEPHIN)  IV  1 g Intravenous Q24H  . enoxaparin (LOVENOX) injection  40 mg Subcutaneous Q24H  . fluconazole (DIFLUCAN) IV  100 mg Intravenous Q24H  . fluconazole (DIFLUCAN) IV  200 mg Intravenous Once  . [COMPLETED]  HYDROmorphone (DILAUDID) injection  1 mg Intravenous Once  . [COMPLETED] ibuprofen  600 mg Oral Once  . [COMPLETED] ondansetron  4 mg Intravenous Once  . sulfamethoxazole-trimethoprim  250 mg Intravenous NOW  . sulfamethoxazole-trimethoprim  250 mg Intravenous Q8H    Assessment: 31yo male c/o worsening fever since knee injury several days ago at work, has c/o N/V and cough but denies wheezing and abdominal discomfort, CXR shows possible bilateral PNA, now with reactive rapid HIV screening, awaiting  confirmation, thought to be with AIDS, to tx for possible PCP as well as candidiasis.  Plan:  Will begin Septra 250mg  IV Q8H and Diflucan 200mg  IV x1 followed by 100mg  IV Q24H (will consider changing to PO if improvement seen and systemic infection r/o).  Colleen Can PharmD BCPS 02/19/2012,1:44 AM

## 2012-02-19 NOTE — Consult Note (Signed)
[facility]  Day 2 ceftriaxone        Day 2 azithromycin        Day 2 trimethoprim sulfamethoxazole        Day 2 fluconazole       Reason for Consult: Evaluate for possible HIV infection in setting of newly diagnosed community-acquired pneumonia    Referring Physician: Dr. Zannie Cove  Principal Problem:  *Community acquired pneumonia Active Problems:  Fever  Hyponatremia  Anemia  Oral candidiasis  Cigarette smoker      . albuterol  2.5 mg Nebulization Q6H  . antiseptic oral rinse  15 mL Mouth Rinse BID  . [COMPLETED] azithromycin  500 mg Intravenous Once  . azithromycin  500 mg Intravenous Q24H  . [COMPLETED] cefTRIAXone (ROCEPHIN)  IV  1 g Intravenous Once  . cefTRIAXone (ROCEPHIN)  IV  1 g Intravenous Q24H  . enoxaparin (LOVENOX) injection  40 mg Subcutaneous Q24H  . fluconazole (DIFLUCAN) IV  100 mg Intravenous Q24H  . [COMPLETED] fluconazole (DIFLUCAN) IV  200 mg Intravenous Once  . [COMPLETED]  HYDROmorphone (DILAUDID) injection  1 mg Intravenous Once  . [COMPLETED] ibuprofen  600 mg Oral Once  . nicotine  14 mg Transdermal Q24H  . [COMPLETED] ondansetron  4 mg Intravenous Once  . [COMPLETED] sodium chloride  1,000 mL Intravenous Once  . [COMPLETED] sulfamethoxazole-trimethoprim  250 mg Intravenous NOW  . sulfamethoxazole-trimethoprim  250 mg Intravenous Q8H    Recommendations: 1. Continue current antimicrobial therapy 2. Await results of HIV EIA and Western blot, CD4 count and viral load 3. Check RPR, GC and chlamydia assays  4. Check patient weighed  Assessment: He does appear to have some patchy bilateral infiltrates and describes dyspnea on exertion. His rapid HIV screen was positive but will need to be confirmed by standard HIV EIA and Western blot were making a diagnosis of HIV infection. He was reported to have thrush on admission, which would greatly increase the probability of HIV infection. He is currently not severely  hypoxic so I will not add empiric steroids for possible PCP infection. He states that he is currently unable to produce any sputum so we will need to continue with empiric therapy for community-acquired pneumonia and possible early pneumocystis. I will follow with you.    HPI: Herbert Ellison is a 31 y.o. male [place] who came here several months ago upon the death of his mother. He states that he was injured at work 5 days ago when a coworker with a hand truck fell striking him on the right knee and hip. Around that time he also began to notice dyspnea on exertion. He has had some dry smoker's cough for quite some time that has actually improved since he came in the hospital and has not had a cigarette. He was admitted yesterday after his chest x-ray revealed patchy bilateral infiltrates. His room air O2 sats is normal.  He states that he has been tested on many occasions in the past for HIV infection and all have been negative. He initially stated that he had lost quite a bit of weight but then changed and said that he didn't know how much she weighs now and isn't sure if he has lost any. He states that he has had a rash on his arms and legs for about 6 months. Initially the lesions were pruritic but this resolved spontaneously. He thinks the spots were caused by ant bites. He has also  been having intermittent nodular lesions. One on his right knee resolved spontaneously but he has 2 active lesions now. One is on his left elbow and one is under his chin.  He states that he has always been told that he had a geographic tongue. He has a history of remote anterior cruciate ligament tear in his right knee. He was also told that he had meningitis when he was 33 months old.    Review of Systems: Pertinent items are noted in HPI.  Past Medical History  Diagnosis Date  . Hay fever     when child  . Depression     History  Substance Use Topics  . Smoking status: Current Every Day  Smoker -- 1.0 packs/day    Types: Cigarettes  . Smokeless tobacco: Not on file  . Alcohol Use: No    Family History  Problem Relation Age of Onset  . Congestive Heart Failure Mother     Deceased  . Diabetes Mellitus II Maternal Uncle    No Known Allergies  OBJECTIVE: Blood pressure 96/52, pulse 89, temperature 97.5 F (36.4 C), temperature source Oral, resp. rate 18, height 5\' 5"  (1.651 m), weight 49.7 kg (109 lb 9.1 oz), SpO2 98.00%. General: He is alert and in no distress Skin:  he has scattered, hypopigmented scaly lesions on his arms and legs. He has what appears to be a scar from her previous nodular lesion on his right lateral knee and left lower leg. He has a nodule under his chin and on his left elbow. The left elbow lesion has some central ulceration with a hemorrhagic base.  Lymph nodes: No palpable adenopathy  Oral: No thrush noted currently. He has a beefy red tongue  Lungs: Clear Cor:  regular S1 and S2 no murmurs  Abdomen:  soft, flat and nontender. I do not feel a liver, spleen or any other masses   Microbiology: No results found for this or any previous visit (from the past 240 hour(s)).  Cliffton Asters, MD [facility] [phone] pager   [phone] cell 02/19/2012, 2:00 PM

## 2012-02-19 NOTE — Progress Notes (Signed)
CRITICAL VALUE ALERT  Critical value received: Rapid HIV test (+) Reactive  Date of notification:  02/19/2012  Time of notification:  0100  Critical value read back:yes  Nurse who received alert:  E. Alfons Sulkowski  MD notified (1st page):  Lenny Pastel NP  Time of first page:  0110  MD notified (2nd page):  Time of second page:  Responding MD:  Lenny Pastel NP  Time MD responded:  [phone]

## 2012-02-20 DIAGNOSIS — R509 Fever, unspecified: Secondary | ICD-10-CM

## 2012-02-20 DIAGNOSIS — F17200 Nicotine dependence, unspecified, uncomplicated: Secondary | ICD-10-CM

## 2012-02-20 DIAGNOSIS — J189 Pneumonia, unspecified organism: Secondary | ICD-10-CM

## 2012-02-20 DIAGNOSIS — D649 Anemia, unspecified: Secondary | ICD-10-CM

## 2012-02-20 LAB — URINE CULTURE
Colony Count: NO GROWTH
Culture: NO GROWTH

## 2012-02-20 LAB — LEGIONELLA ANTIGEN, URINE

## 2012-02-20 NOTE — Progress Notes (Signed)
Patient ID: Herbert Ellison, male   DOB: 28-Apr-1980, 31 y.o.   MRN: [id number]    [facility]    Date of Admission:  02/18/2012   Day 3 ceftriaxone                     Day  3 azithromycin        Day  3 trimethoprim sulfamethoxazole        Day  3 fluconazole Principal Problem:  *Community acquired pneumonia Active Problems:  Fever  Hyponatremia  Anemia  Oral candidiasis  Cigarette smoker      . antiseptic oral rinse  15 mL Mouth Rinse BID  . azithromycin  500 mg Intravenous Q24H  . cefTRIAXone (ROCEPHIN)  IV  1 g Intravenous Q24H  . enoxaparin (LOVENOX) injection  40 mg Subcutaneous Q24H  . fluconazole (DIFLUCAN) IV  100 mg Intravenous Q24H  . nicotine  14 mg Transdermal Q24H  . sulfamethoxazole-trimethoprim  250 mg Intravenous Q8H  . [COMPLETED] white petrolatum      . [DISCONTINUED] albuterol  2.5 mg Nebulization Q6H  . [DISCONTINUED] oxymetazoline  1 spray Each Nare BID    Subjective:  He feels a little better with less dyspnea on exertion  Objective: Temp:  [97.1 F (36.2 C)-103 F (39.4 C)] 98.3 F (36.8 C) (11/24 1011) Pulse Rate:  [100-117] 100  (11/24 1011) Resp:  [20] 20  (11/24 1011) BP: (86-138)/(44-87) 86/58 mmHg (11/24 1011) SpO2:  [94 %-100 %] 98 % (11/24 1011)  General:  Good spirits Skin:  No change in chronic rash Lungs:  Clear Cor:  Regular S1 and S2 no murmurs  Assessment:  He did not have blood cultures on admission and continues to have high fever to 103. I will check blood cultures today and continue current broad empiric therapy. It is still unclear if he has HIV infection.  Plan: 1.  Continue current antibiotics 2.  Await HIV studies 3. Blood cultures  Cliffton Asters, MD [facility] [phone] pager   [phone] cell 02/20/2012, 12:36 PM

## 2012-02-20 NOTE — Progress Notes (Signed)
Pt c/o nasal congestion that made it hard to sleep. Order received for Afrin nasal spray to help with congestion. Patient expressed much relief. Will cont to monitor.

## 2012-02-20 NOTE — Progress Notes (Addendum)
[facility]             Progress Note   Subjective: Chest pain and Sob better  Objective: Vital signs in last 24 hours: Temp:  [97.1 F (36.2 C)-103 F (39.4 C)] 99.8 F (37.7 C) (11/24 0702) Pulse Rate:  [89-117] 111  (11/24 0521) Resp:  [18-20] 20  (11/24 0521) BP: (93-138)/(44-87) 105/62 mmHg (11/24 0521) SpO2:  [94 %-100 %] 100 % (11/24 0521) Weight change:  Last BM Date: 02/18/12  Intake/Output from previous day: 11/23 0701 - 11/24 0700 In: 2461.3 [I.V.:1411.3; IV Piggyback:1050] Out: 6575 [Urine:6575] Total I/O In: -  Out: 1000 [Urine:1000]   Physical Exam: General: Alert, awake, oriented x3, in no acute distress. HEENT: No bruits, no goiter, Heart: Regular rate and rhythm, without murmurs, rubs, gallops. Lungs: scattered ronchi Abdomen: Soft, nontender, nondistended, positive bowel sounds. Extremities: No clubbing cyanosis or edema with positive pedal pulses. Neuro: Grossly intact, nonfocal. Skin: diffuse scaling and hypopigmented plaques all over    Lab Results: Basic Metabolic Panel:  [facility] 02/19/12 0605 02/18/12 1959  NA 134* 125*  K 3.3* 4.0  CL 99 91*  CO2 24 26  GLUCOSE 115* 106*  BUN 8 8  CREATININE 0.63 0.81  CALCIUM 8.3* 8.6  MG -- --  PHOS -- --   Liver Function Tests:  [facility] 02/18/12 2307 02/18/12 1959  AST 17 20  ALT 12 13  ALKPHOS 42 49  BILITOT 0.2* 0.2*  PROT 7.3 8.4*  ALBUMIN 2.3* 2.7*   No results found for this basename: LIPASE:2,AMYLASE:2 in the last 72 hours No results found for this basename: AMMONIA:2 in the last 72 hours CBC:  Basename 02/19/12 0605 02/18/12 1959  WBC 7.0 8.8  NEUTROABS -- 7.0  HGB 8.5* 8.0*  HCT 26.5* 24.5*  MCV 83.1 82.5  PLT 228 274   Cardiac Enzymes: No results found for this basename: CKTOTAL:3,CKMB:3,CKMBINDEX:3,TROPONINI:3 in the last 72 hours BNP: No results found for this basename: PROBNP:3 in the last 72 hours D-Dimer: No results found for this basename:  DDIMER:2 in the last 72 hours CBG: No results found for this basename: GLUCAP:6 in the last 72 hours Hemoglobin A1C: No results found for this basename: HGBA1C in the last 72 hours Fasting Lipid Panel: No results found for this basename: CHOL,HDL,LDLCALC,TRIG,CHOLHDL,LDLDIRECT in the last 72 hours Thyroid Function Tests: No results found for this basename: TSH,T4TOTAL,FREET4,T3FREE,THYROIDAB in the last 72 hours Anemia Panel: No results found for this basename: VITAMINB12,FOLATE,FERRITIN,TIBC,IRON,RETICCTPCT in the last 72 hours Coagulation: No results found for this basename: LABPROT:2,INR:2 in the last 72 hours Urine Drug Screen: Drugs of Abuse     Component Value Date/Time   LABOPIA NONE DETECTED 02/19/2012 0907   COCAINSCRNUR NONE DETECTED 02/19/2012 0907   LABBENZ NONE DETECTED 02/19/2012 0907   AMPHETMU NONE DETECTED 02/19/2012 0907   THCU POSITIVE* 02/19/2012 0907   LABBARB NONE DETECTED 02/19/2012 0907    Alcohol Level: No results found for this basename: ETH:2 in the last 72 hours Urinalysis:  Basename 02/18/12 1956  COLORURINE YELLOW  LABSPEC 1.016  PHURINE 6.5  GLUCOSEU NEGATIVE  HGBUR NEGATIVE  BILIRUBINUR NEGATIVE  KETONESUR NEGATIVE  PROTEINUR 100*  UROBILINOGEN 1.0  NITRITE NEGATIVE  LEUKOCYTESUR NEGATIVE    No results found for this or any previous visit (from the past 240 hour(s)).  Studies/Results: Dg Chest 2 View  02/18/2012  *RADIOLOGY REPORT*  Clinical Data: Fever and chills.  CHEST - 2 VIEW  Comparison: None.  Findings: There is evidence of multifocal  pneumonia with discrete infiltrates present in the right upper lobe and the lingula.  There may also be involvement of the left lower lobe.  There is an associated small right pleural effusion.  No pulmonary edema.  The heart size is normal.  IMPRESSION: Bilateral pneumonia in the right upper lobe and lingula.  There may also be infiltrate in the left lower lobe.  Associated small right pleural  effusion is present.   Original Report Authenticated By: Irish Lack, M.D.    Dg Hip Complete Right  02/18/2012  *RADIOLOGY REPORT*  Clinical Data: Pain post blunt trauma.  RIGHT HIP - COMPLETE 2+ VIEW  Comparison: None.  Findings: Negative for fracture, dislocation, or other acute abnormality.  Normal alignment and mineralization. No significant degenerative change.  Regional soft tissues unremarkable.  IMPRESSION:  Negative   Original Report Authenticated By: D. Andria Rhein, MD    Ct Head Wo Contrast  02/19/2012  *RADIOLOGY REPORT*  Clinical Data:  Injury from fall with frontal headache and posterior neck pain.  CT HEAD WITHOUT CONTRAST CT CERVICAL SPINE WITHOUT CONTRAST  Technique:  Multidetector CT imaging of the head and cervical spine was performed following the standard protocol without intravenous contrast.  Multiplanar CT image reconstructions of the cervical spine were also generated.  Comparison:   None  CT HEAD  Findings: Technically limited study due to streak artifact likely from motion. The ventricles and sulci are symmetrical without significant effacement, displacement, or dilatation. No mass effect or midline shift. No abnormal extra-axial fluid collections. The grey-white matter junction is distinct. Basal cisterns are not effaced. No acute intracranial hemorrhage. No depressed skull fractures.  Opacification of right ethmoid air cells, likely inflammatory.  Remaining paranasal sinuses and mastoid air cells are not opacified.  IMPRESSION: No acute intracranial abnormalities.  CT CERVICAL SPINE  Findings: Normal alignment of the cervical vertebrae and facet joints.  Lateral masses of C1 are symmetrical.  The odontoid process appears intact.  No vertebral compression deformities. Intervertebral disc space heights are preserved.  No prevertebral soft tissue swelling.  No focal bone lesion or bone destruction. Bone cortex and trabecular architecture appear intact.  IMPRESSION: No displaced  fractures identified.   Original Report Authenticated By: Burman Nieves, M.D.    Ct Cervical Spine Wo Contrast  02/19/2012  *RADIOLOGY REPORT*  Clinical Data:  Injury from fall with frontal headache and posterior neck pain.  CT HEAD WITHOUT CONTRAST CT CERVICAL SPINE WITHOUT CONTRAST  Technique:  Multidetector CT imaging of the head and cervical spine was performed following the standard protocol without intravenous contrast.  Multiplanar CT image reconstructions of the cervical spine were also generated.  Comparison:   None  CT HEAD  Findings: Technically limited study due to streak artifact likely from motion. The ventricles and sulci are symmetrical without significant effacement, displacement, or dilatation. No mass effect or midline shift. No abnormal extra-axial fluid collections. The grey-white matter junction is distinct. Basal cisterns are not effaced. No acute intracranial hemorrhage. No depressed skull fractures.  Opacification of right ethmoid air cells, likely inflammatory.  Remaining paranasal sinuses and mastoid air cells are not opacified.  IMPRESSION: No acute intracranial abnormalities.  CT CERVICAL SPINE  Findings: Normal alignment of the cervical vertebrae and facet joints.  Lateral masses of C1 are symmetrical.  The odontoid process appears intact.  No vertebral compression deformities. Intervertebral disc space heights are preserved.  No prevertebral soft tissue swelling.  No focal bone lesion or bone destruction. Bone cortex and trabecular architecture appear  intact.  IMPRESSION: No displaced fractures identified.   Original Report Authenticated By: Burman Nieves, M.D.    Dg Knee Complete 4 Views Right  02/18/2012  *RADIOLOGY REPORT*  Clinical Data: Pain post blunt trauma  RIGHT KNEE - COMPLETE 4+ VIEW  Comparison: None.  Findings: No effusion. Negative for fracture, dislocation, or other acute abnormality.  Normal alignment and mineralization. No significant degenerative change.   Regional soft tissues unremarkable.  IMPRESSION:  Negative   Original Report Authenticated By: D. Andria Rhein, MD     Medications: Scheduled Meds:    . antiseptic oral rinse  15 mL Mouth Rinse BID  . azithromycin  500 mg Intravenous Q24H  . cefTRIAXone (ROCEPHIN)  IV  1 g Intravenous Q24H  . enoxaparin (LOVENOX) injection  40 mg Subcutaneous Q24H  . fluconazole (DIFLUCAN) IV  100 mg Intravenous Q24H  . nicotine  14 mg Transdermal Q24H  . sulfamethoxazole-trimethoprim  250 mg Intravenous Q8H  . [COMPLETED] white petrolatum      . [DISCONTINUED] albuterol  2.5 mg Nebulization Q6H  . [DISCONTINUED] oxymetazoline  1 spray Each Nare BID   Continuous Infusions:    . sodium chloride 125 mL/hr (02/19/12 1308)   PRN Meds:.acetaminophen, acetaminophen, acetaminophen, albuterol, alum & mag hydroxide-simeth, HYDROmorphone (DILAUDID) injection, ondansetron (ZOFRAN) IV, ondansetron, oxyCODONE-acetaminophen, oxymetazoline, zolpidem, [DISCONTINUED] oxyCODONE, [DISCONTINUED] oxyCODONE-acetaminophen  Assessment/Plan:  1. Community acquired pneumonia Concern for PCP pneumonia Continue Bactrim, Will check pneumocystis DFA Also continue ceftriaxone and azithromycin Greatly appreciate Dr. Blair Dolphin consult  2. HIV screen positive Await Western Blot, I have not discussed this with patient yet  3. Anemia: anemia panel pending  DVT proph: lovenox Family communication: discussed with patient at bedside Disposition: inpatient    Time spent coordinating care:   LOS: 2 days   [facility] Pager: [phone] 02/20/2012, 9:18 AM

## 2012-02-21 LAB — DIFFERENTIAL
Eosinophils Absolute: 0.3 10*3/uL (ref 0.0–0.7)
Eosinophils Relative: 12 % — ABNORMAL HIGH (ref 0–5)
Lymphs Abs: 0.3 10*3/uL — ABNORMAL LOW (ref 0.7–4.0)
Monocytes Absolute: 0.4 10*3/uL (ref 0.1–1.0)
Monocytes Relative: 20 % — ABNORMAL HIGH (ref 3–12)

## 2012-02-21 LAB — CBC
Hemoglobin: 7.7 g/dL — ABNORMAL LOW (ref 13.0–17.0)
MCH: 26.6 pg (ref 26.0–34.0)
MCV: 80.6 fL (ref 78.0–100.0)
RBC: 2.89 MIL/uL — ABNORMAL LOW (ref 4.22–5.81)

## 2012-02-21 LAB — T-HELPER CELLS (CD4) COUNT (NOT AT ARMC)
CD4 % Helper T Cell: 1 % — ABNORMAL LOW (ref 33–55)
CD4 T Cell Abs: 40 uL — ABNORMAL LOW (ref 400–2700)

## 2012-02-21 MED ORDER — FLUCONAZOLE 200 MG PO TABS
200.0000 mg | ORAL_TABLET | Freq: Every day | ORAL | Status: DC
  Administered 2012-02-21 – 2012-02-23 (×3): 200 mg via ORAL
  Filled 2012-02-21 (×4): qty 1

## 2012-02-21 NOTE — Progress Notes (Signed)
[facility]             Progress Note   Subjective: Chest pain and Sob better, in good spirits  Objective: Vital signs in last 24 hours: Temp:  [98 F (36.7 C)-100.3 F (37.9 C)] 98.9 F (37.2 C) (11/25 1433) Pulse Rate:  [87-118] 118  (11/25 1433) Resp:  [16-20] 18  (11/25 1433) BP: (90-137)/(53-78) 126/78 mmHg (11/25 1433) SpO2:  [95 %-100 %] 97 % (11/25 1433) Weight change:  Last BM Date: 02/20/12  Intake/Output from previous day: 11/24 0701 - 11/25 0700 In: 2393.8 [P.O.:1440; I.V.:953.8] Out: 8325 [Urine:8325] Total I/O In: -  Out: 600 [Urine:600]   Physical Exam: General: Alert, awake, oriented x3, in no acute distress. HEENT: No bruits, no goiter, Heart: Regular rate and rhythm, without murmurs, rubs, gallops. Lungs: scattered ronchi Abdomen: Soft, nontender, nondistended, positive bowel sounds. Extremities: No clubbing cyanosis or edema with positive pedal pulses. Neuro: Grossly intact, nonfocal. Skin: diffuse scaling and hypopigmented plaques all over    Lab Results: Basic Metabolic Panel:  [facility] 02/19/12 0605 02/18/12 1959  NA 134* 125*  K 3.3* 4.0  CL 99 91*  CO2 24 26  GLUCOSE 115* 106*  BUN 8 8  CREATININE 0.63 0.81  CALCIUM 8.3* 8.6  MG -- --  PHOS -- --   Liver Function Tests:  [facility] 02/18/12 2307 02/18/12 1959  AST 17 20  ALT 12 13  ALKPHOS 42 49  BILITOT 0.2* 0.2*  PROT 7.3 8.4*  ALBUMIN 2.3* 2.7*   No results found for this basename: LIPASE:2,AMYLASE:2 in the last 72 hours No results found for this basename: AMMONIA:2 in the last 72 hours CBC:  Basename 02/21/12 1132 02/21/12 0930 02/19/12 0605 02/18/12 1959  WBC -- 2.1* 7.0 --  NEUTROABS 1.1* -- -- 7.0  HGB -- 7.7* 8.5* --  HCT -- 23.3* 26.5* --  MCV -- 80.6 83.1 --  PLT -- 252 228 --   Cardiac Enzymes: No results found for this basename: CKTOTAL:3,CKMB:3,CKMBINDEX:3,TROPONINI:3 in the last 72 hours BNP: No results found for this basename: PROBNP:3  in the last 72 hours D-Dimer: No results found for this basename: DDIMER:2 in the last 72 hours CBG: No results found for this basename: GLUCAP:6 in the last 72 hours Hemoglobin A1C: No results found for this basename: HGBA1C in the last 72 hours Fasting Lipid Panel: No results found for this basename: CHOL,HDL,LDLCALC,TRIG,CHOLHDL,LDLDIRECT in the last 72 hours Thyroid Function Tests: No results found for this basename: TSH,T4TOTAL,FREET4,T3FREE,THYROIDAB in the last 72 hours Anemia Panel: No results found for this basename: VITAMINB12,FOLATE,FERRITIN,TIBC,IRON,RETICCTPCT in the last 72 hours Coagulation: No results found for this basename: LABPROT:2,INR:2 in the last 72 hours Urine Drug Screen: Drugs of Abuse     Component Value Date/Time   LABOPIA NONE DETECTED 02/19/2012 0907   COCAINSCRNUR NONE DETECTED 02/19/2012 0907   LABBENZ NONE DETECTED 02/19/2012 0907   AMPHETMU NONE DETECTED 02/19/2012 0907   THCU POSITIVE* 02/19/2012 0907   LABBARB NONE DETECTED 02/19/2012 0907    Alcohol Level: No results found for this basename: ETH:2 in the last 72 hours Urinalysis:  Basename 02/18/12 1956  COLORURINE YELLOW  LABSPEC 1.016  PHURINE 6.5  GLUCOSEU NEGATIVE  HGBUR NEGATIVE  BILIRUBINUR NEGATIVE  KETONESUR NEGATIVE  PROTEINUR 100*  UROBILINOGEN 1.0  NITRITE NEGATIVE  LEUKOCYTESUR NEGATIVE    Recent Results (from the past 240 hour(s))  URINE CULTURE     Status: Normal   Collection Time   02/18/12  7:56 PM  Component Value Range Status Comment   Specimen Description URINE, RANDOM   Final    Special Requests NONE   Final    Culture  Setup Time 02/18/2012 20:30   Final    Colony Count NO GROWTH   Final    Culture NO GROWTH   Final    Report Status 02/20/2012 FINAL   Final   CULTURE, BLOOD (ROUTINE X 2)     Status: Normal (Preliminary result)   Collection Time   02/20/12 12:55 PM      Component Value Range Status Comment   Specimen Description BLOOD ARM RIGHT    Final    Special Requests BOTTLES DRAWN AEROBIC ONLY 10CC   Final    Culture  Setup Time 02/20/2012 19:18   Final    Culture     Final    Value:        BLOOD CULTURE RECEIVED NO GROWTH TO DATE CULTURE WILL BE HELD FOR 5 DAYS BEFORE ISSUING A FINAL NEGATIVE REPORT   Report Status PENDING   Incomplete   CULTURE, BLOOD (ROUTINE X 2)     Status: Normal (Preliminary result)   Collection Time   02/20/12  1:00 PM      Component Value Range Status Comment   Specimen Description BLOOD HAND RIGHT   Final    Special Requests BOTTLES DRAWN AEROBIC ONLY 10CC   Final    Culture  Setup Time 02/20/2012 19:19   Final    Culture     Final    Value:        BLOOD CULTURE RECEIVED NO GROWTH TO DATE CULTURE WILL BE HELD FOR 5 DAYS BEFORE ISSUING A FINAL NEGATIVE REPORT   Report Status PENDING   Incomplete     Studies/Results: No results found.  Medications: Scheduled Meds:    . antiseptic oral rinse  15 mL Mouth Rinse BID  . azithromycin  500 mg Intravenous Q24H  . cefTRIAXone (ROCEPHIN)  IV  1 g Intravenous Q24H  . enoxaparin (LOVENOX) injection  40 mg Subcutaneous Q24H  . fluconazole  200 mg Oral QHS  . nicotine  14 mg Transdermal Q24H  . sulfamethoxazole-trimethoprim  250 mg Intravenous Q8H  . [DISCONTINUED] fluconazole (DIFLUCAN) IV  100 mg Intravenous Q24H   Continuous Infusions:    . sodium chloride 75 mL/hr (02/20/12 0933)   PRN Meds:.acetaminophen, acetaminophen, acetaminophen, albuterol, alum & mag hydroxide-simeth, HYDROmorphone (DILAUDID) injection, ondansetron (ZOFRAN) IV, ondansetron, oxyCODONE-acetaminophen, oxymetazoline, zolpidem  Assessment/Plan:  1. Community acquired pneumonia Concern for PCP pneumonia Continue Bactrim, pneumocystis DFA pending Also continue ceftriaxone and azithromycin Greatly appreciate IDinput  2. HIV screen positive Await Western Blot, told patient that we are checking for this and are awaiting results  3. Anemia: anemia panel still  pending  DVT proph: lovenox Family communication: discussed with patient at bedside using spanish interpreter Disposition: inpatient    Time spent coordinating care:   LOS: 3 days   [facility] Pager: [phone] 02/21/2012, 3:57 PM

## 2012-02-21 NOTE — Progress Notes (Addendum)
Patient ID: Herbert Ellison, male   DOB: 1980/06/28, 31 y.o.   MRN: [id number]    [facility]    Date of Admission:  02/18/2012   Day 4 ceftriaxone                         Day  4 azithromycin        Day  4 trimethoprim sulfamethoxazole        Day  4 fluconazole Principal Problem:  *Community acquired pneumonia Active Problems:  Hyponatremia  Anemia  Fever  Oral candidiasis  Cigarette smoker      . antiseptic oral rinse  15 mL Mouth Rinse BID  . azithromycin  500 mg Intravenous Q24H  . cefTRIAXone (ROCEPHIN)  IV  1 g Intravenous Q24H  . enoxaparin (LOVENOX) injection  40 mg Subcutaneous Q24H  . fluconazole (DIFLUCAN) IV  100 mg Intravenous Q24H  . nicotine  14 mg Transdermal Q24H  . sulfamethoxazole-trimethoprim  250 mg Intravenous Q8H    Subjective: -> afebrile, still pleuretic pain on the left from injury. We spoke at length with translator regarding new HIV diagnosis and need for treatment.  He reports having illicit drug use with cocaine, crack, marijuana but has stopped. No IVDU. No cocaine or crack use in the last 30 days. He has had sex with both men/women, preference for women. No TB.  ROS: significant weight loss (BL weight of 160) - down 50#. Has had fever,nightsweats <1-2 wks, c/o dysphagia x 3 wks  Objective: Temp:  [98 F (36.7 C)-102.7 F (39.3 C)] 98.2 F (36.8 C) (11/25 1005) Pulse Rate:  [87-114] 95  (11/25 1005) Resp:  [16-20] 18  (11/25 1005) BP: (90-137)/(53-77) 112/62 mmHg (11/25 1005) SpO2:  [95 %-100 %] 95 % (11/25 1005)  General:  Good spirits Skin:  No change in chronic rash Lungs:  Clear Cor:  Regular S1 and S2 no murmurs  Assessment:    Plan: 1.  pneumonia = including presumptive PCP. Continue current antibiotics 2. HIV= awaiting WB confirmation, CD4 count, VL, hiv genotype.  Suspect CD 4 count < 200 given his leukopenia and TLC 3. Esophageal candidiasis = will switch to oral fluconazole and increased  dose 4. HIV work up= will check quantiferon, hepatitis a/b/c 5. Health maintenance = will give flu vaccine and pneumococcal prior to discharge  Judyann Munson, MD [facility] [phone]/ [phone] 02/21/2012, 2:13 PM

## 2012-02-22 LAB — IRON AND TIBC: Saturation Ratios: 39 % (ref 20–55)

## 2012-02-22 LAB — HEPATITIS B SURFACE ANTIBODY,QUALITATIVE: Hep B S Ab: NEGATIVE

## 2012-02-22 LAB — RETICULOCYTES
RBC.: 3.4 MIL/uL — ABNORMAL LOW (ref 4.22–5.81)
Retic Ct Pct: 1 % (ref 0.4–3.1)

## 2012-02-22 LAB — HEPATITIS B SURFACE ANTIGEN: Hepatitis B Surface Ag: NEGATIVE

## 2012-02-22 LAB — VITAMIN B12: Vitamin B-12: 382 pg/mL (ref 211–911)

## 2012-02-22 LAB — GC PROBE AMPLIFICATION, URINE: GC Probe Amp, Urine: NEGATIVE

## 2012-02-22 MED ORDER — BOOST / RESOURCE BREEZE PO LIQD
1.0000 | Freq: Three times a day (TID) | ORAL | Status: DC
  Administered 2012-02-22 – 2012-02-24 (×5): 1 via ORAL

## 2012-02-22 NOTE — Progress Notes (Signed)
Offered patient bath privilege, but requested to have tonight.

## 2012-02-22 NOTE — Progress Notes (Signed)
ANTIBIOTIC CONSULT NOTE - FOLLOW UP  Pharmacy Consult for Septra/Fluconazole Indication: pneumonia + Candidiasis  No Known Allergies  Patient Measurements: Height: 5\' 5"  (165.1 cm) Weight: 109 lb 9.1 oz (49.7 kg) IBW/kg (Calculated) : 61.5  Adjusted Body Weight:    Vital Signs: Temp: 97.5 F (36.4 C) (11/26 0933) Temp src: Oral (11/26 0933) BP: 107/90 mmHg (11/26 0933) Pulse Rate: 85  (11/26 0933) Intake/Output from previous day: 11/25 0701 - 11/26 0700 In: -  Out: 3100 [Urine:3100] Intake/Output from this shift:    Labs:  Basename 02/21/12 0930  WBC 2.1*  HGB 7.7*  PLT 252  LABCREA --  CREATININE --   Estimated Creatinine Clearance: 92.3 ml/min (by C-G formula based on Cr of 0.63). No results found for this basename: VANCOTROUGH:2,VANCOPEAK:2,VANCORANDOM:2,GENTTROUGH:2,GENTPEAK:2,GENTRANDOM:2,TOBRATROUGH:2,TOBRAPEAK:2,TOBRARND:2,AMIKACINPEAK:2,AMIKACINTROU:2,AMIKACIN:2, in the last 72 hours   Microbiology: Recent Results (from the past 720 hour(s))  URINE CULTURE     Status: Normal   Collection Time   02/18/12  7:56 PM      Component Value Range Status Comment   Specimen Description URINE, RANDOM   Final    Special Requests NONE   Final    Culture  Setup Time 02/18/2012 20:30   Final    Colony Count NO GROWTH   Final    Culture NO GROWTH   Final    Report Status 02/20/2012 FINAL   Final   CULTURE, BLOOD (ROUTINE X 2)     Status: Normal (Preliminary result)   Collection Time   02/20/12 12:55 PM      Component Value Range Status Comment   Specimen Description BLOOD ARM RIGHT   Final    Special Requests BOTTLES DRAWN AEROBIC ONLY 10CC   Final    Culture  Setup Time 02/20/2012 19:18   Final    Culture     Final    Value:        BLOOD CULTURE RECEIVED NO GROWTH TO DATE CULTURE WILL BE HELD FOR 5 DAYS BEFORE ISSUING A FINAL NEGATIVE REPORT   Report Status PENDING   Incomplete   CULTURE, BLOOD (ROUTINE X 2)     Status: Normal (Preliminary result)   Collection  Time   02/20/12  1:00 PM      Component Value Range Status Comment   Specimen Description BLOOD HAND RIGHT   Final    Special Requests BOTTLES DRAWN AEROBIC ONLY 10CC   Final    Culture  Setup Time 02/20/2012 19:19   Final    Culture     Final    Value:        BLOOD CULTURE RECEIVED NO GROWTH TO DATE CULTURE WILL BE HELD FOR 5 DAYS BEFORE ISSUING A FINAL NEGATIVE REPORT   Report Status PENDING   Incomplete     Anti-infectives     Start     Dose/Rate Route Frequency Ordered Stop   02/21/12 2200   fluconazole (DIFLUCAN) tablet 200 mg        200 mg Oral Daily at bedtime 02/21/12 1418     02/19/12 2200   fluconazole (DIFLUCAN) IVPB 100 mg  Status:  Discontinued        100 mg 50 mL/hr over 60 Minutes Intravenous Every 24 hours 02/19/12 0144 02/21/12 1414   02/19/12 2100   azithromycin (ZITHROMAX) 500 mg in dextrose 5 % 250 mL IVPB        500 mg 250 mL/hr over 60 Minutes Intravenous Every 24 hours 02/18/12 2313     02/19/12 2000  cefTRIAXone (ROCEPHIN) 1 g in dextrose 5 % 50 mL IVPB        1 g 100 mL/hr over 30 Minutes Intravenous Every 24 hours 02/18/12 2313     02/19/12 0800  sulfamethoxazole-trimethoprim (BACTRIM) 250 mg in dextrose 5 % 250 mL IVPB       250 mg 265.6 mL/hr over 60 Minutes Intravenous Every 8 hours 02/19/12 0144     02/19/12 0145  sulfamethoxazole-trimethoprim (BACTRIM) 250 mg in dextrose 5 % 250 mL IVPB       250 mg 265.6 mL/hr over 60 Minutes Intravenous NOW 02/19/12 0144 02/19/12 0320   02/19/12 0145   fluconazole (DIFLUCAN) IVPB 200 mg        200 mg 100 mL/hr over 60 Minutes Intravenous  Once 02/19/12 0144 02/19/12 0441   02/18/12 2115   cefTRIAXone (ROCEPHIN) 1 g in dextrose 5 % 50 mL IVPB        1 g 100 mL/hr over 30 Minutes Intravenous  Once 02/18/12 2111 02/18/12 2230   02/18/12 2115   azithromycin (ZITHROMAX) 500 mg in dextrose 5 % 250 mL IVPB        500 mg 250 mL/hr over 60 Minutes Intravenous  Once 02/18/12 2111 02/18/12 2350           Assessment: 31yo male c/o worsening fever since knee injury several days ago at work, has c/o N/V and cough but denies wheezing and abdominal discomfort,   ID: CXR shows possible bilateral PNA, Rapid HIV screen POSITIVE. Treat for possible PCP PNA as well as candidiasis. Afebrile. WBC down to 2.1. Await EIA and Western Blot, viral load and CD4 11/23: Azithro>> 11/23: Rocephin>> 11/23: Septra>> 11/23: Diflucan>>  Anticoagulation: Lovenox 40mg /d. Hgb down to 7.7. Platelets ok. Cardiovascular: VSS Gastrointestinal / Nutrition: Regular diet Neurology: depression. Nicotine patch Nephrology: CrCl 92. Scr 0.63 Pulmonary: CAP, ? PCP RA Hematology / Oncology: Hg down to 7.7 (baseline 8.5) PTA Medication Issues: none Best Practices: enox   Plan:   Septra 250mg  IV Q8H (15mg /kg/d trimeth) and  Diflucan 200mg  po hs  Merilynn Finland, [employer] 02/22/2012,12:31 PM

## 2012-02-22 NOTE — Progress Notes (Signed)
INITIAL ADULT NUTRITION ASSESSMENT Date: 02/22/2012   Time: 8:01 AM Reason for Assessment: RN report  Pt meets criteria for SEVERE MALNUTRITION in the context of chronic illness as evidenced by 32% weight loss in 1 year, reported intake of <75% of his needs in > 1 month.   INTERVENTION: Resource Breeze po TID, each supplement provides 250 kcal and 9 grams of protein. Encouraged adequate nutrition intake    DOCUMENTATION CODES Per approved criteria  -Severe malnutrition in the context of chronic illness -Underweight    ASSESSMENT: Male 31 y.o.  Dx: Community acquired pneumonia  Hx:  Past Medical History  Diagnosis Date  . Hay fever     when child  . Depression    History reviewed. No pertinent past surgical history.  Related Meds:  Scheduled Meds:   . antiseptic oral rinse  15 mL Mouth Rinse BID  . azithromycin  500 mg Intravenous Q24H  . cefTRIAXone (ROCEPHIN)  IV  1 g Intravenous Q24H  . enoxaparin (LOVENOX) injection  40 mg Subcutaneous Q24H  . fluconazole  200 mg Oral QHS  . nicotine  14 mg Transdermal Q24H  . sulfamethoxazole-trimethoprim  250 mg Intravenous Q8H  . [DISCONTINUED] fluconazole (DIFLUCAN) IV  100 mg Intravenous Q24H   Continuous Infusions:   . sodium chloride 75 mL/hr (02/20/12 0933)   PRN Meds:.acetaminophen, acetaminophen, acetaminophen, albuterol, alum & mag hydroxide-simeth, HYDROmorphone (DILAUDID) injection, ondansetron (ZOFRAN) IV, ondansetron, oxyCODONE-acetaminophen, oxymetazoline, zolpidem  Ht: 5\' 5"  (165.1 cm)  Wt: 109 lb 9.1 oz (49.7 kg)  Ideal Wt: 61.8 kg % Ideal Wt: 80%  Usual Wt: 160 lb 1 year ago Wt Readings from Last 10 Encounters:  02/19/12 109 lb 9.1 oz (49.7 kg)   % Usual Wt: 68%  Body mass index is 18.23 kg/(m^2). Underweight  Food/Nutrition Related Hx: pt reports poor appetite resulting in weight loss  Labs:  CMP     Component Value Date/Time   NA 134* 02/19/2012 0605   K 3.3* 02/19/2012 0605   CL 99  02/19/2012 0605   CO2 24 02/19/2012 0605   GLUCOSE 115* 02/19/2012 0605   BUN 8 02/19/2012 0605   CREATININE 0.63 02/19/2012 0605   CALCIUM 8.3* 02/19/2012 0605   PROT 7.3 02/18/2012 2307   ALBUMIN 2.3* 02/18/2012 2307   AST 17 02/18/2012 2307   ALT 12 02/18/2012 2307   ALKPHOS 42 02/18/2012 2307   BILITOT 0.2* 02/18/2012 2307   GFRNONAA >90 02/19/2012 0605   GFRAA >90 02/19/2012 0605  CBG (last 3)  No results found for this basename: GLUCAP:3 in the last 72 hours   Intake/Output Summary (Last 24 hours) at 02/22/12 0806 Last data filed at 02/22/12 0643  Gross per 24 hour  Intake      0 ml  Output   3100 ml  Net  -3100 ml    Last BM: 11/24  Diet Order: General Meal Completion 10%  Supplements/Tube Feeding: none  IVF:    sodium chloride Last Rate: 75 mL/hr (02/20/12 0933)   Pt admitted with CAP, HIV screen positive, awaiting Western Blot.  Pt with hx of drug use. Pt with esophageal candidiasis. Spoke with pt through on-site interpreter. Pt report severe weight loss over the past year due to poor appetite, drug abuse, depression from loss of mom 3 months ago.   Estimated Nutritional Needs:   Kcal:  1800-2000 Protein:  90-110 grams Fluid:  >1.8 L/day  NUTRITION DIAGNOSIS: Unintentional weight loss related to decreased appetite, drug use, loss of mom  as evidenced by 32% weight loss x 1 year.   MONITORING/EVALUATION(Goals): Goal: Pt to meet >/= 90% of their estimated nutrition needs Monitor: PO intake, supplement acceptance, weight  EDUCATION NEEDS: -Education needs addressed  Kendell Bane RD, LDN, CNSC [phone] Pager [phone] After Hours Pager  02/22/2012, 8:01 AM

## 2012-02-22 NOTE — Progress Notes (Addendum)
[facility]             Progress Note  Brief summary:  31 y.o. male from [place] just recently moved to [place] was brought to the ED due to fevers chills and cough for the past 4 days. He has had fevers to 103. .  In the ED he was evaluated by the EDP, and a chest x-Ray was done which revealed bilateral pneumonia. Rapid HIV screen positive  Subjective: Chest pain and Sob better, no new complaints  Objective: Vital signs in last 24 hours: Temp:  [97.5 F (36.4 C)-98.1 F (36.7 C)] 97.7 F (36.5 C) (11/26 2200) Pulse Rate:  [75-87] 85  (11/26 2200) Resp:  [16-18] 18  (11/26 2200) BP: (91-110)/(50-95) 102/51 mmHg (11/26 2200) SpO2:  [98 %-99 %] 98 % (11/26 2200) Weight change:  Last BM Date: 02/22/12  Intake/Output from previous day: 11/25 0701 - 11/26 0700 In: -  Out: 3100 [Urine:3100] Total I/O In: -  Out: 825 [Urine:825]   Physical Exam: General: Alert, awake, oriented x3, in no acute distress. HEENT: No bruits, no goiter, Heart: Regular rate and rhythm, without murmurs, rubs, gallops. Lungs: scattered ronchi Abdomen: Soft, nontender, nondistended, positive bowel sounds. Extremities: No clubbing cyanosis or edema with positive pedal pulses. Neuro: Grossly intact, nonfocal. Skin: diffuse scaling and hypopigmented plaques all over    Lab Results: Basic Metabolic Panel: No results found for this basename: NA:2,K:2,CL:2,CO2:2,GLUCOSE:2,BUN:2,CREATININE:2,CALCIUM:2,MG:2,PHOS:2 in the last 72 hours Liver Function Tests: No results found for this basename: AST:2,ALT:2,ALKPHOS:2,BILITOT:2,PROT:2,ALBUMIN:2 in the last 72 hours No results found for this basename: LIPASE:2,AMYLASE:2 in the last 72 hours No results found for this basename: AMMONIA:2 in the last 72 hours CBC:  Basename 02/21/12 1132 02/21/12 0930  WBC -- 2.1*  NEUTROABS 1.1* --  HGB -- 7.7*  HCT -- 23.3*  MCV -- 80.6  PLT -- 252   Cardiac Enzymes: No results found for this  basename: CKTOTAL:3,CKMB:3,CKMBINDEX:3,TROPONINI:3 in the last 72 hours BNP: No results found for this basename: PROBNP:3 in the last 72 hours D-Dimer: No results found for this basename: DDIMER:2 in the last 72 hours CBG: No results found for this basename: GLUCAP:6 in the last 72 hours Hemoglobin A1C: No results found for this basename: HGBA1C in the last 72 hours Fasting Lipid Panel: No results found for this basename: CHOL,HDL,LDLCALC,TRIG,CHOLHDL,LDLDIRECT in the last 72 hours Thyroid Function Tests: No results found for this basename: TSH,T4TOTAL,FREET4,T3FREE,THYROIDAB in the last 72 hours Anemia Panel:  Basename 02/22/12 1118  VITAMINB12 382  FOLATE --  FERRITIN 1452*  TIBC 250  IRON 98  RETICCTPCT 1.0   Coagulation: No results found for this basename: LABPROT:2,INR:2 in the last 72 hours Urine Drug Screen: Drugs of Abuse     Component Value Date/Time   LABOPIA NONE DETECTED 02/19/2012 0907   COCAINSCRNUR NONE DETECTED 02/19/2012 0907   LABBENZ NONE DETECTED 02/19/2012 0907   AMPHETMU NONE DETECTED 02/19/2012 0907   THCU POSITIVE* 02/19/2012 0907   LABBARB NONE DETECTED 02/19/2012 0907    Alcohol Level: No results found for this basename: ETH:2 in the last 72 hours Urinalysis: No results found for this basename: COLORURINE:2,APPERANCEUR:2,LABSPEC:2,PHURINE:2,GLUCOSEU:2,HGBUR:2,BILIRUBINUR:2,KETONESUR:2,PROTEINUR:2,UROBILINOGEN:2,NITRITE:2,LEUKOCYTESUR:2 in the last 72 hours  Recent Results (from the past 240 hour(s))  URINE CULTURE     Status: Normal   Collection Time   02/18/12  7:56 PM      Component Value Range Status Comment   Specimen Description URINE, RANDOM   Final    Special Requests NONE   Final  Culture  Setup Time 02/18/2012 20:30   Final    Colony Count NO GROWTH   Final    Culture NO GROWTH   Final    Report Status 02/20/2012 FINAL   Final   CULTURE, BLOOD (ROUTINE X 2)     Status: Normal (Preliminary result)   Collection Time   02/20/12  12:55 PM      Component Value Range Status Comment   Specimen Description BLOOD ARM RIGHT   Final    Special Requests BOTTLES DRAWN AEROBIC ONLY 10CC   Final    Culture  Setup Time 02/20/2012 19:18   Final    Culture     Final    Value:        BLOOD CULTURE RECEIVED NO GROWTH TO DATE CULTURE WILL BE HELD FOR 5 DAYS BEFORE ISSUING A FINAL NEGATIVE REPORT   Report Status PENDING   Incomplete   CULTURE, BLOOD (ROUTINE X 2)     Status: Normal (Preliminary result)   Collection Time   02/20/12  1:00 PM      Component Value Range Status Comment   Specimen Description BLOOD HAND RIGHT   Final    Special Requests BOTTLES DRAWN AEROBIC ONLY 10CC   Final    Culture  Setup Time 02/20/2012 19:19   Final    Culture     Final    Value:        BLOOD CULTURE RECEIVED NO GROWTH TO DATE CULTURE WILL BE HELD FOR 5 DAYS BEFORE ISSUING A FINAL NEGATIVE REPORT   Report Status PENDING   Incomplete     Studies/Results: No results found.  Medications: Scheduled Meds:    . antiseptic oral rinse  15 mL Mouth Rinse BID  . azithromycin  500 mg Intravenous Q24H  . cefTRIAXone (ROCEPHIN)  IV  1 g Intravenous Q24H  . enoxaparin (LOVENOX) injection  40 mg Subcutaneous Q24H  . feeding supplement  1 Container Oral TID BM  . fluconazole  200 mg Oral QHS  . nicotine  14 mg Transdermal Q24H  . sulfamethoxazole-trimethoprim  250 mg Intravenous Q8H   Continuous Infusions:    . sodium chloride 75 mL/hr at 02/22/12 1529   PRN Meds:.acetaminophen, acetaminophen, acetaminophen, albuterol, alum & mag hydroxide-simeth, HYDROmorphone (DILAUDID) injection, ondansetron (ZOFRAN) IV, ondansetron, oxyCODONE-acetaminophen, oxymetazoline, zolpidem  Assessment/Plan:  1. Community acquired pneumonia Concern for PCP pneumonia Continue Bactrim, D5 pneumocystis DFA pending Also continue ceftriaxone and azithromycin, D5 Greatly appreciate IDinput  2. HIV screen positive Await Western Blot, told patient that we are checking  for this and are awaiting results CD4 = 40, consistent with AIDS  3. Anemia: anemia panel still pending, apparently didn't get sent initially, reordered, suspect secondary to AIDS  DVT proph: lovenox Family communication: discussed with patient at bedside using spanish interpreter Disposition: inpatient, per ID recommendations    Time spent coordinating care:   LOS: 4 days   [facility] Pager: [phone] 02/22/2012, 11:52 PM

## 2012-02-22 NOTE — Progress Notes (Signed)
Patient ID: Herbert Ellison, male   DOB: Dec 23, 1980, 31 y.o.   MRN: [id number]    [facility]    Date of Admission:  02/18/2012   Day 5 ceftriaxone                         Day  5 azithromycin        Day  5 trimethoprim sulfamethoxazole        Day 5 fluconazole Principal Problem:  *Community acquired pneumonia Active Problems:  Hyponatremia  Anemia  Fever  Oral candidiasis  Cigarette smoker      . antiseptic oral rinse  15 mL Mouth Rinse BID  . azithromycin  500 mg Intravenous Q24H  . cefTRIAXone (ROCEPHIN)  IV  1 g Intravenous Q24H  . enoxaparin (LOVENOX) injection  40 mg Subcutaneous Q24H  . feeding supplement  1 Container Oral TID BM  . fluconazole  200 mg Oral QHS  . nicotine  14 mg Transdermal Q24H  . sulfamethoxazole-trimethoprim  250 mg Intravenous Q8H    Subjective: -> afebrile, still pleuretic pain. Denies cough  Objective: Temp:  [97.5 F (36.4 C)-98.2 F (36.8 C)] 97.5 F (36.4 C) (11/26 1509) Pulse Rate:  [75-100] 87  (11/26 1509) Resp:  [16-18] 18  (11/26 1509) BP: (91-123)/(50-95) 110/95 mmHg (11/26 1509) SpO2:  [97 %-99 %] 99 % (11/26 1509)  General:  Good spirits Skin:  No change in chronic rash Lungs:  Clear Cor:  Regular S1 and S2 no murmurs  Assessment:  31yo hispanic male with newly diagnosed, advanced HIV disease, CD 4 count of 40, VL pending, genotype and WB confirmation pending. Presents with weight loss, pneumonia, and candidal esophagitis    Plan: 1.  pneumonia = including presumptive PCP. Continue current antibiotics for CAP and PCP pneumonia. Last day of azithromycin is today and would switch to OI proph of azithromycin. Still awaiting for PCP stain. If pcp stain is negative, we may still presumptively treat for pcp given the insiduous onset and his low CD 4 count. 2. HIV= awaiting WB confirmation,  VL, hiv genotype.  Will likely start empiric regimen tomorrow of truvada, darunavir, and ritonavir since we  have these available to give to the patient until his paperwork is started for getting free HIV meds through adap. 3. Esophageal candidiasis = continue with fluconazole 200mg  daily 4. HIV work up= quantiferon pending. He appears hep A immune 5. Health maintenance = will give flu vaccine and pneumococcal prior to discharge 6. OI proph = recommend azithromycin 1200mg  Q sunday 7. Case management = please have case management start paperwork for ADAP, in addition, the patient will need to see if can have hospital provide bactrim for PCP treatment. Our clinic can provide ART, fluconazole and azithromycin until his paperwork is processed  Judyann Munson, MD [facility] [phone]/ [phone] 02/22/2012, 4:21 PM

## 2012-02-23 DIAGNOSIS — E871 Hypo-osmolality and hyponatremia: Secondary | ICD-10-CM

## 2012-02-23 DIAGNOSIS — B370 Candidal stomatitis: Secondary | ICD-10-CM

## 2012-02-23 DIAGNOSIS — J189 Pneumonia, unspecified organism: Secondary | ICD-10-CM

## 2012-02-23 LAB — QUANTIFERON TB GOLD ASSAY (BLOOD): Interferon Gamma Release Assay: NEGATIVE

## 2012-02-23 MED ORDER — AZITHROMYCIN 600 MG PO TABS: 1200.0000 mg | ORAL_TABLET | ORAL | Status: DC

## 2012-02-23 MED ORDER — SULFAMETHOXAZOLE-TRIMETHOPRIM 400-80 MG PO TABS
3.0000 | ORAL_TABLET | Freq: Three times a day (TID) | ORAL | Status: DC
  Administered 2012-02-23 – 2012-02-24 (×3): 3 via ORAL
  Filled 2012-02-23 (×6): qty 3

## 2012-02-23 MED ORDER — DARUNAVIR ETHANOLATE 800 MG PO TABS
800.0000 mg | ORAL_TABLET | Freq: Every day | ORAL | Status: DC
  Administered 2012-02-24: 800 mg via ORAL
  Filled 2012-02-23 (×2): qty 1

## 2012-02-23 MED ORDER — EMTRICITABINE-TENOFOVIR DF 200-300 MG PO TABS
1.0000 | ORAL_TABLET | Freq: Every day | ORAL | Status: DC
  Administered 2012-02-23 – 2012-02-24 (×2): 1 via ORAL
  Filled 2012-02-23 (×2): qty 1

## 2012-02-23 MED ORDER — RITONAVIR 100 MG PO TABS
100.0000 mg | ORAL_TABLET | Freq: Every day | ORAL | Status: DC
  Administered 2012-02-24: 100 mg via ORAL
  Filled 2012-02-23 (×2): qty 1

## 2012-02-23 NOTE — Progress Notes (Signed)
[facility] PROGRESS NOTE  Herbert Ellison ZOX:[id number] DOB: 1980-12-21 DOA: 02/18/2012 PCP: No primary provider on file.  Assessment/Plan: 1. Community acquired pneumonia  Concern for PCP pneumonia  Continue Bactrim, rocephin  pneumocystis DFA pending  Change azithromycin to PO weekly for OI prophylaxsis  Greatly appreciate ID input- case manager to help arrange medications  2. HIV screen positive  Await Western Blot, told patient that we are checking for this and are awaiting results  CD4 = 40, consistent with AIDS   3. Anemia: suspect secondary to AIDS   Code Status: full Family Communication: patient at bedside- did not want interpreter Disposition Plan: home when ok with ID   Consultants:  ID  Antibiotics:  Azithromycin 1200 mg q Sunday  Bactrim/rocephin 11/22  HPI/Subjective: Pain with deep breathing, no fever, no chills  Objective: Filed Vitals:   02/22/12 1823 02/22/12 2200 02/23/12 0200 02/23/12 0600  BP: 106/63 102/51 90/60 104/53  Pulse: 87 85 94 70  Temp: 98.1 F (36.7 C) 97.7 F (36.5 C) 97.8 F (36.6 C) 97.3 F (36.3 C)  TempSrc: Oral Oral Oral Oral  Resp: 18 18 18 18   Height:      Weight:      SpO2: 99% 98% 97% 98%    Intake/Output Summary (Last 24 hours) at 02/23/12 0838 Last data filed at 02/23/12 8119  Gross per 24 hour  Intake   1025 ml  Output   4425 ml  Net  -3400 ml   Filed Weights   02/19/12 0020  Weight: 49.7 kg (109 lb 9.1 oz)    Exam:   General:  Pleasant/cooperative  Cardiovascular: rrr  Respiratory: occ ronchi, no wheezing  Abdomen: +BS, soft, NT/ND  Data Reviewed: Basic Metabolic Panel:  Lab 02/19/12 1478 02/18/12 1959  NA 134* 125*  K 3.3* 4.0  CL 99 91*  CO2 24 26  GLUCOSE 115* 106*  BUN 8 8  CREATININE 0.63 0.81  CALCIUM 8.3* 8.6  MG -- --  PHOS -- --   Liver Function Tests:  Lab 02/18/12 2307 02/18/12 1959  AST 17 20  ALT 12 13  ALKPHOS 42 49  BILITOT 0.2* 0.2*  PROT  7.3 8.4*  ALBUMIN 2.3* 2.7*   No results found for this basename: LIPASE:5,AMYLASE:5 in the last 168 hours No results found for this basename: AMMONIA:5 in the last 168 hours CBC:  Lab 02/21/12 1132 02/21/12 0930 02/19/12 0605 02/18/12 1959  WBC -- 2.1* 7.0 8.8  NEUTROABS 1.1* -- -- 7.0  HGB -- 7.7* 8.5* 8.0*  HCT -- 23.3* 26.5* 24.5*  MCV -- 80.6 83.1 82.5  PLT -- 252 228 274   Cardiac Enzymes: No results found for this basename: CKTOTAL:5,CKMB:5,CKMBINDEX:5,TROPONINI:5 in the last 168 hours BNP (last 3 results) No results found for this basename: PROBNP:3 in the last 8760 hours CBG: No results found for this basename: GLUCAP:5 in the last 168 hours  Recent Results (from the past 240 hour(s))  URINE CULTURE     Status: Normal   Collection Time   02/18/12  7:56 PM      Component Value Range Status Comment   Specimen Description URINE, RANDOM   Final    Special Requests NONE   Final    Culture  Setup Time 02/18/2012 20:30   Final    Colony Count NO GROWTH   Final    Culture NO GROWTH   Final    Report Status 02/20/2012 FINAL   Final   CULTURE, BLOOD (ROUTINE X  2)     Status: Normal (Preliminary result)   Collection Time   02/20/12 12:55 PM      Component Value Range Status Comment   Specimen Description BLOOD ARM RIGHT   Final    Special Requests BOTTLES DRAWN AEROBIC ONLY 10CC   Final    Culture  Setup Time 02/20/2012 19:18   Final    Culture     Final    Value:        BLOOD CULTURE RECEIVED NO GROWTH TO DATE CULTURE WILL BE HELD FOR 5 DAYS BEFORE ISSUING A FINAL NEGATIVE REPORT   Report Status PENDING   Incomplete   CULTURE, BLOOD (ROUTINE X 2)     Status: Normal (Preliminary result)   Collection Time   02/20/12  1:00 PM      Component Value Range Status Comment   Specimen Description BLOOD HAND RIGHT   Final    Special Requests BOTTLES DRAWN AEROBIC ONLY 10CC   Final    Culture  Setup Time 02/20/2012 19:19   Final    Culture     Final    Value:        BLOOD  CULTURE RECEIVED NO GROWTH TO DATE CULTURE WILL BE HELD FOR 5 DAYS BEFORE ISSUING A FINAL NEGATIVE REPORT   Report Status PENDING   Incomplete      Studies: No results found.  Scheduled Meds:   . antiseptic oral rinse  15 mL Mouth Rinse BID  . azithromycin  500 mg Intravenous Q24H  . cefTRIAXone (ROCEPHIN)  IV  1 g Intravenous Q24H  . enoxaparin (LOVENOX) injection  40 mg Subcutaneous Q24H  . feeding supplement  1 Container Oral TID BM  . fluconazole  200 mg Oral QHS  . nicotine  14 mg Transdermal Q24H  . sulfamethoxazole-trimethoprim  250 mg Intravenous Q8H   Continuous Infusions:   . sodium chloride 75 mL/hr at 02/23/12 0600    Principal Problem:  *Community acquired pneumonia Active Problems:  Hyponatremia  Anemia  Fever  Oral candidiasis  Cigarette smoker    Time spent: 25    [facility], JESSICA  [facility] Pager [phone]. If 8PM-8AM, please contact night-coverage at [email or web address], password [facility] 02/23/2012, 8:38 AM  LOS: 5 days

## 2012-02-23 NOTE — Progress Notes (Signed)
Pt ambulated approx. 1000 feet with no assistance. Pt was not dizzy, pt very stable. Encouraged pt to continue walking this afternoon.

## 2012-02-23 NOTE — Care Management Note (Signed)
  Page 2 of 2   02/23/2012     2:47:23 PM   CARE MANAGEMENT NOTE 02/23/2012  Patient:  Herbert Ellison   Account Number:  [id number]  Date Initiated:  02/23/2012  Documentation initiated by:  [facility]  Subjective/Objective Assessment:   Pneumonia, HIV     Action/Plan:   lives with friend, Channing Mutters   Anticipated DC Date:  02/25/2012   Anticipated DC Plan:  HOME/SELF CARE      DC Planning Services  CM consult  Medication Assistance      Choice offered to / List presented to:             Status of service:  In process, will continue to follow Medicare Important Message given?   (If response is "NO", the following Medicare IM given date fields will be blank) Date Medicare IM given:   Date Additional Medicare IM given:    Discharge Disposition:    Per UR Regulation:    If discussed at Long Length of Stay Meetings, dates discussed:    Comments:  02/23/2012 1420 Interpreter available from [employer], [place] [phone]. Pt does not want any of his medical information shared with friends. He currently lives with Channing Mutters, friend and friend's mother, Dorann Lodge # [phone]. NCM had interpreter explained the importance of follow up with [facility] to complete application for ADAP ( AIDS Drug Assistance Program). Pt states he wants appt on Friday because he works. NCM contacted ID Clinic and Dr. Drue Second will follow up with pt on antiviral/HIV meds. Pt will complete application for ADAP at his appt. Appt is scheduled for March 07, 2012,  at 1:40 pm. Pt will need to bring financial information to complete application for ADAP. Provided pt with fact sheet about ADAP for him to review prior to his appt. Isidoro Donning RN CCM Case Mgmt phone [phone]  02/23/2012 1245 NCM spoke to pt and his English is limited. Contacted CSW for interpreter. Interpreter will be available until d/c. NCM called to Dr. Drue Second and can provide abx through [facility] program at d/c.  His antiviral/HIV meds would not qualify due to cost.  Isidoro Donning RN CCM Case Mgmt phone [phone]

## 2012-02-23 NOTE — Progress Notes (Addendum)
Patient ID: Herbert Ellison, male   DOB: [birth year], 31 y.o.   MRN: [id number]    [facility]    Date of Admission:  02/18/2012   Day 6 ceftriaxone             Day 6 trimethoprim sulfamethoxazole        Day 6  Fluconazole        (finished 5 days of azithro) Principal Problem:  *Community acquired pneumonia Active Problems:  Hyponatremia  Anemia  Fever  Oral candidiasis  Cigarette smoker      . antiseptic oral rinse  15 mL Mouth Rinse BID  . azithromycin  1,200 mg Oral Weekly  . cefTRIAXone (ROCEPHIN)  IV  1 g Intravenous Q24H  . enoxaparin (LOVENOX) injection  40 mg Subcutaneous Q24H  . feeding supplement  1 Container Oral TID BM  . fluconazole  200 mg Oral QHS  . nicotine  14 mg Transdermal Q24H  . sulfamethoxazole-trimethoprim  250 mg Intravenous Q8H  . [DISCONTINUED] azithromycin  500 mg Intravenous Q24H  . [DISCONTINUED] azithromycin  1,200 mg Oral Weekly    Subjective: -> afebrile, still pleuretic pain. Denies cough  Objective: Temp:  [97.3 F (36.3 C)-98.1 F (36.7 C)] 97.3 F (36.3 C) (11/27 0600) Pulse Rate:  [70-94] 70  (11/27 0600) Resp:  [18] 18  (11/27 0600) BP: (90-110)/(51-95) 104/53 mmHg (11/27 0600) SpO2:  [97 %-99 %] 98 % (11/27 0600)  General:  A xo by 3 in NAD Skin:  No change in chronic rash Lungs:  Clear Cor:  Regular S1 and S2 no murmurs  Assessment:  31yo hispanic male with newly diagnosed, advanced HIV disease, CD 4 count of 40, VL pending, genotype and WB confirmation pending. Presents with weight loss, pneumonia, and candidal esophagitis    Plan: 1.  pneumonia(CAP vs. PCP) = including presumptive PCP. Continue current antibiotics for CAP finish 7 day course tomorrow. For presumed PCP, he will need a total of 21 day of bactrim then switch to bactrim DS daily. We will switch his IV bactrim to oral equivalent today. 2. HIV= awaiting WB confirmation,  VL, hiv genotype.  Will likely start empiric regimen  today of truvada, darunavir, and ritonavir since we have these available to give to the patient until his paperwork is started for getting free HIV meds through adap. Anticipate to switch him to single tablet regimen once he has ADAP insurance as an outpatient. 3. Esophageal candidiasis = continue with fluconazole 200mg  daily 4. HIV work up= quantiferon pending. He appears hep A immune 5. Health maintenance = will need to give flu vaccine and pneumococcal prior to discharge 6. OI proph = recommend azithromycin 1200mg  Q sunday 7. Case management = please have case management start paperwork for ADAP, in addition, the patient will need to see if can have hospital provide bactrim for PCP treatment.  We have given him a 30 day supply of the following medications:  darunavir 800mg , ritonavir 100mg , truvada, fluconazole 200mg  and azithromycin and instructions via phone interpreter to how to take his medications.  If he is discharged before Monday, please make sure he is given bactrim to finish out course for PCP ( he has no insurance to afford medications.)  Please give him the following info: he is to come to HIV clinic, [facility] at [address] Phone number is [phone]. He is to come next Friday for intake and ADAP paperwork.  Dr. Luciana Axe to see  patient in the morning  Judyann Munson, MD [facility] [phone]/ [phone] 02/23/2012, 8:49 AM

## 2012-02-24 LAB — BASIC METABOLIC PANEL
BUN: 5 mg/dL — ABNORMAL LOW (ref 6–23)
CO2: 25 mEq/L (ref 19–32)
Calcium: 9.8 mg/dL (ref 8.4–10.5)
Chloride: 100 mEq/L (ref 96–112)
Creatinine, Ser: 0.76 mg/dL (ref 0.50–1.35)
GFR calc Af Amer: >90 mL/min (ref >90)

## 2012-02-24 LAB — CBC
HCT: 26.3 % — ABNORMAL LOW (ref 39.0–52.0)
MCH: 25.5 pg — ABNORMAL LOW (ref 26.0–34.0)
MCV: 81.7 fL (ref 78.0–100.0)
RDW: 16.4 % — ABNORMAL HIGH (ref 11.5–15.5)
WBC: 3 10*3/uL — ABNORMAL LOW (ref 4.0–10.5)

## 2012-02-24 MED ORDER — FLUCONAZOLE 200 MG PO TABS: 200.0000 mg | ORAL_TABLET | Freq: Every day | ORAL | Status: DC

## 2012-02-24 MED ORDER — OXYCODONE-ACETAMINOPHEN 5-325 MG PO TABS: 1.0000 | ORAL_TABLET | Freq: Four times a day (QID) | ORAL | Status: DC | PRN

## 2012-02-24 MED ORDER — SULFAMETHOXAZOLE-TRIMETHOPRIM 400-80 MG PO TABS: 3.0000 | ORAL_TABLET | Freq: Three times a day (TID) | ORAL | Status: DC

## 2012-02-24 MED ORDER — DARUNAVIR ETHANOLATE 800 MG PO TABS: 800.0000 mg | ORAL_TABLET | Freq: Every day | ORAL | Status: DC

## 2012-02-24 MED ORDER — AZITHROMYCIN 600 MG PO TABS: 1200.0000 mg | ORAL_TABLET | ORAL | Status: DC

## 2012-02-24 MED ORDER — RITONAVIR 100 MG PO TABS: 100.0000 mg | ORAL_TABLET | Freq: Every day | ORAL | Status: DC

## 2012-02-24 MED ORDER — EMTRICITABINE-TENOFOVIR DF 200-300 MG PO TABS: 1.0000 | ORAL_TABLET | Freq: Every day | ORAL | Status: DC

## 2012-02-24 NOTE — Discharge Summary (Signed)
Physician Discharge Summary  Herbert Ellison VHQ:[id number] DOB: [birth year] DOA: 02/18/2012  PCP: No primary provider on file.  Admit date: 02/18/2012 Discharge date: 02/24/2012  Time spent: 35 minutes  Discharge Diagnoses:  Principal Problem:  *Community acquired pneumonia Active Problems:  Hyponatremia  Anemia  Fever  Oral candidiasis  Cigarette smoker   Discharge Condition: improved  Diet recommendation: regular  Filed Weights   02/19/12 0020  Weight: 49.7 kg (109 lb 9.1 oz)    History of present illness:  Herbert Ellison is a 31 y.o. male from [place] who has been brought to [facility] by friends of his family after the loss of his mother. He was brought to the ED due to fevers chills and cough for the past 4 days. He has had fevers to 103. He speaks very little [place], and his family friends are at the bedside and interpret.  In the ED he was evaluated by the EDP, and a chest x-Ray was done which revealed bilateral pneumonia. A rapid HIV test was also ordered. He was placed on IV Rocephin and Azithromycin to cover Community Acquired Pneumonia and referred for medical admission.    Hospital Course:  1. pneumonia(CAP vs. PCP) = including presumptive PCP. Continue current antibiotics for CAP finish 7 day course. For presumed PCP, he will need a total of 21 day of bactrim then switch to bactrim DS daily.  2. HIV=start empiric regimen of truvada, darunavir, and ritonavir since we have these available to give to the patient until his paperwork is started for getting free HIV meds through adap. Anticipate to switch him to single tablet regimen once he has ADAP insurance as an outpatient. 3. Esophageal candidiasis = continue with fluconazole 200mg  daily for 2 weeks 4. Health maintenance = will need to give flu vaccine and pneumococcal prior to discharge 5. OI proph = recommend azithromycin 1200mg  Q sunday 6. Case management = case management start paperwork  for ADAP, in addition, the patient will need to see if can have hospital provide bactrim for PCP treatment. We have given him a 30 day supply of the following medications: darunavir 800mg , ritonavir 100mg , truvada, fluconazole 200mg  and azithromycin and instructions via phone interpreter to how to take his medications. 7. Tobacco abuse- encourage cessation 8. C/o groin pain- defer to outpatient work up by PCP- avoid heavy lifting   Consultations:  ID  Discharge Exam: Filed Vitals:   02/23/12 2110 02/24/12 0202 02/24/12 0554 02/24/12 1033  BP: 108/68 113/66 117/74 110/71  Pulse: 82 102 102 107  Temp: 98.5 F (36.9 C) 98.4 F (36.9 C) 97.7 F (36.5 C) 97.9 F (36.6 C)  TempSrc: Oral Oral Oral Oral  Resp: 20 20 20 18   Height:      Weight:      SpO2: 100% 99% 100% 98%    General: A+Ox3, NAD Cardiovascular: rrr Respiratory: no wheezing  Discharge Instructions      Discharge Orders    Future Appointments: Provider: Department: Dept Phone: Center:   03/07/2012 1:45 PM Judyann Munson, MD [facility] [phone] [facility]     Future Orders Please Complete By Expires   Diet general      Increase activity slowly      Discharge instructions      Comments:   Avoid heavy lifting until hernias can be addressed as an outpatient Be sure to follow up with appointments Be sure to take medications as prescribed   Discharge instructions  Comments:   Stop smoking       Medication List     As of 02/24/2012  1:41 PM    TAKE these medications         acetaminophen 500 MG tablet   Commonly known as: TYLENOL   Take 1,000 mg by mouth every 6 (six) hours as needed. For pain      azithromycin 600 MG tablet   Commonly known as: ZITHROMAX   Take 2 tablets (1,200 mg total) by mouth once a week.      Darunavir Ethanolate 800 MG tablet   Commonly known as: PREZISTA   Take 1 tablet (800 mg total) by mouth daily with breakfast.       emtricitabine-tenofovir 200-300 MG per tablet   Commonly known as: TRUVADA   Take 1 tablet by mouth daily.      fluconazole 200 MG tablet   Commonly known as: DIFLUCAN   Take 1 tablet (200 mg total) by mouth at bedtime.      oxyCODONE-acetaminophen 5-325 MG per tablet   Commonly known as: PERCOCET/ROXICET   Take 1-2 tablets by mouth every 6 (six) hours as needed.      ritonavir 100 MG Tabs   Commonly known as: NORVIR   Take 1 tablet (100 mg total) by mouth daily with breakfast.      sulfamethoxazole-trimethoprim 400-80 MG per tablet   Commonly known as: BACTRIM,SEPTRA   Take 3 tablets by mouth every 8 (eight) hours.         Follow-up Information    Follow up with Judyann Munson, MD. On 03/07/2012. (appointment time 1:40 pm. Please do not miss your appointment. Please bring financial information to complete ADAP application. If you cannot make your appt, please call the office.  )    Contact information:   [address] [place] [address] [phone]           The results of significant diagnostics from this hospitalization (including imaging, microbiology, ancillary and laboratory) are listed below for reference.    Significant Diagnostic Studies: Dg Chest 2 View  02/18/2012  *RADIOLOGY REPORT*  Clinical Data: Fever and chills.  CHEST - 2 VIEW  Comparison: None.  Findings: There is evidence of multifocal pneumonia with discrete infiltrates present in the right upper lobe and the lingula.  There may also be involvement of the left lower lobe.  There is an associated small right pleural effusion.  No pulmonary edema.  The heart size is normal.  IMPRESSION: Bilateral pneumonia in the right upper lobe and lingula.  There may also be infiltrate in the left lower lobe.  Associated small right pleural effusion is present.   Original Report Authenticated By: Irish Lack, M.D.    Dg Hip Complete Right  02/18/2012  *RADIOLOGY REPORT*  Clinical Data: Pain post  blunt trauma.  RIGHT HIP - COMPLETE 2+ VIEW  Comparison: None.  Findings: Negative for fracture, dislocation, or other acute abnormality.  Normal alignment and mineralization. No significant degenerative change.  Regional soft tissues unremarkable.  IMPRESSION:  Negative   Original Report Authenticated By: D. Andria Rhein, MD    Ct Head Wo Contrast  02/19/2012  *RADIOLOGY REPORT*  Clinical Data:  Injury from fall with frontal headache and posterior neck pain.  CT HEAD WITHOUT CONTRAST CT CERVICAL SPINE WITHOUT CONTRAST  Technique:  Multidetector CT imaging of the head and cervical spine was performed following the standard protocol without intravenous contrast.  Multiplanar CT image reconstructions  of the cervical spine were also generated.  Comparison:   None  CT HEAD  Findings: Technically limited study due to streak artifact likely from motion. The ventricles and sulci are symmetrical without significant effacement, displacement, or dilatation. No mass effect or midline shift. No abnormal extra-axial fluid collections. The grey-white matter junction is distinct. Basal cisterns are not effaced. No acute intracranial hemorrhage. No depressed skull fractures.  Opacification of right ethmoid air cells, likely inflammatory.  Remaining paranasal sinuses and mastoid air cells are not opacified.  IMPRESSION: No acute intracranial abnormalities.  CT CERVICAL SPINE  Findings: Normal alignment of the cervical vertebrae and facet joints.  Lateral masses of C1 are symmetrical.  The odontoid process appears intact.  No vertebral compression deformities. Intervertebral disc space heights are preserved.  No prevertebral soft tissue swelling.  No focal bone lesion or bone destruction. Bone cortex and trabecular architecture appear intact.  IMPRESSION: No displaced fractures identified.   Original Report Authenticated By: Burman Nieves, M.D.    Ct Cervical Spine Wo Contrast  02/19/2012  *RADIOLOGY REPORT*  Clinical Data:   Injury from fall with frontal headache and posterior neck pain.  CT HEAD WITHOUT CONTRAST CT CERVICAL SPINE WITHOUT CONTRAST  Technique:  Multidetector CT imaging of the head and cervical spine was performed following the standard protocol without intravenous contrast.  Multiplanar CT image reconstructions of the cervical spine were also generated.  Comparison:   None  CT HEAD  Findings: Technically limited study due to streak artifact likely from motion. The ventricles and sulci are symmetrical without significant effacement, displacement, or dilatation. No mass effect or midline shift. No abnormal extra-axial fluid collections. The grey-white matter junction is distinct. Basal cisterns are not effaced. No acute intracranial hemorrhage. No depressed skull fractures.  Opacification of right ethmoid air cells, likely inflammatory.  Remaining paranasal sinuses and mastoid air cells are not opacified.  IMPRESSION: No acute intracranial abnormalities.  CT CERVICAL SPINE  Findings: Normal alignment of the cervical vertebrae and facet joints.  Lateral masses of C1 are symmetrical.  The odontoid process appears intact.  No vertebral compression deformities. Intervertebral disc space heights are preserved.  No prevertebral soft tissue swelling.  No focal bone lesion or bone destruction. Bone cortex and trabecular architecture appear intact.  IMPRESSION: No displaced fractures identified.   Original Report Authenticated By: Burman Nieves, M.D.    Dg Knee Complete 4 Views Right  02/18/2012  *RADIOLOGY REPORT*  Clinical Data: Pain post blunt trauma  RIGHT KNEE - COMPLETE 4+ VIEW  Comparison: None.  Findings: No effusion. Negative for fracture, dislocation, or other acute abnormality.  Normal alignment and mineralization. No significant degenerative change.  Regional soft tissues unremarkable.  IMPRESSION:  Negative   Original Report Authenticated By: D. Andria Rhein, MD     Microbiology: Recent Results (from the past  240 hour(s))  URINE CULTURE     Status: Normal   Collection Time   02/18/12  7:56 PM      Component Value Range Status Comment   Specimen Description URINE, RANDOM   Final    Special Requests NONE   Final    Culture  Setup Time 02/18/2012 20:30   Final    Colony Count NO GROWTH   Final    Culture NO GROWTH   Final    Report Status 02/20/2012 FINAL   Final   CULTURE, BLOOD (ROUTINE X 2)     Status: Normal (Preliminary result)   Collection Time   02/20/12 12:55 PM  Component Value Range Status Comment   Specimen Description BLOOD ARM RIGHT   Final    Special Requests BOTTLES DRAWN AEROBIC ONLY 10CC   Final    Culture  Setup Time 02/20/2012 19:18   Final    Culture     Final    Value:        BLOOD CULTURE RECEIVED NO GROWTH TO DATE CULTURE WILL BE HELD FOR 5 DAYS BEFORE ISSUING A FINAL NEGATIVE REPORT   Report Status PENDING   Incomplete   CULTURE, BLOOD (ROUTINE X 2)     Status: Normal (Preliminary result)   Collection Time   02/20/12  1:00 PM      Component Value Range Status Comment   Specimen Description BLOOD HAND RIGHT   Final    Special Requests BOTTLES DRAWN AEROBIC ONLY 10CC   Final    Culture  Setup Time 02/20/2012 19:19   Final    Culture     Final    Value:        BLOOD CULTURE RECEIVED NO GROWTH TO DATE CULTURE WILL BE HELD FOR 5 DAYS BEFORE ISSUING A FINAL NEGATIVE REPORT   Report Status PENDING   Incomplete      Labs: Basic Metabolic Panel:  Lab 02/24/12 1610 02/19/12 0605 02/18/12 1959  NA 137 134* 125*  K 3.7 3.3* 4.0  CL 100 99 91*  CO2 25 24 26   GLUCOSE 96 115* 106*  BUN 5* 8 8  CREATININE 0.76 0.63 0.81  CALCIUM 9.8 8.3* 8.6  MG -- -- --  PHOS -- -- --   Liver Function Tests:  Lab 02/18/12 2307 02/18/12 1959  AST 17 20  ALT 12 13  ALKPHOS 42 49  BILITOT 0.2* 0.2*  PROT 7.3 8.4*  ALBUMIN 2.3* 2.7*   No results found for this basename: LIPASE:5,AMYLASE:5 in the last 168 hours No results found for this basename: AMMONIA:5 in the last 168  hours CBC:  Lab 02/24/12 0630 02/21/12 1132 02/21/12 0930 02/19/12 0605 02/18/12 1959  WBC 3.0* -- 2.1* 7.0 8.8  NEUTROABS -- 1.1* -- -- 7.0  HGB 8.2* -- 7.7* 8.5* 8.0*  HCT 26.3* -- 23.3* 26.5* 24.5*  MCV 81.7 -- 80.6 83.1 82.5  PLT 297 -- 252 228 274   Cardiac Enzymes: No results found for this basename: CKTOTAL:5,CKMB:5,CKMBINDEX:5,TROPONINI:5 in the last 168 hours BNP: BNP (last 3 results) No results found for this basename: PROBNP:3 in the last 8760 hours CBG: No results found for this basename: GLUCAP:5 in the last 168 hours     Signed:  Marlin Canary  [facility] 02/24/2012, 1:41 PM

## 2012-02-24 NOTE — Progress Notes (Signed)
Physician Discharge Summary  Herbert Ellison WGN:[id number] DOB: 1980-10-10 DOA: 02/18/2012  PCP: No primary provider on file.  Admit date: 02/18/2012 Discharge date: 02/24/2012  Time spent: 35 minutes  Discharge Diagnoses:  Principal Problem:  *Community acquired pneumonia Active Problems:  Hyponatremia  Anemia  Fever  Oral candidiasis  Cigarette smoker   Discharge Condition: improved  Diet recommendation: regular  Filed Weights   02/19/12 0020  Weight: 49.7 kg (109 lb 9.1 oz)    History of present illness:  Herbert Ellison is a 31 y.o. male [place] who has been brought to [place] by friends of his family after the loss of his mother. He was brought to the ED due to fevers chills and cough for the past 4 days. He has had fevers to 103. He speaks very little [place], and his family friends are at the bedside and interpret.  In the ED he was evaluated by the EDP, and a chest x-Ray was done which revealed bilateral pneumonia. A rapid HIV test was also ordered. He was placed on IV Rocephin and Azithromycin to cover Community Acquired Pneumonia and referred for medical admission.    Hospital Course:  1. pneumonia(CAP vs. PCP) = including presumptive PCP. Continue current antibiotics for CAP finish 7 day course. For presumed PCP, he will need a total of 21 day of bactrim then switch to bactrim DS daily.  2. HIV=start empiric regimen of truvada, darunavir, and ritonavir since we have these available to give to the patient until his paperwork is started for getting free HIV meds through adap. Anticipate to switch him to single tablet regimen once he has ADAP insurance as an outpatient. 3. Esophageal candidiasis = continue with fluconazole 200mg  daily for 2 weeks 4. Health maintenance = will need to give flu vaccine and pneumococcal prior to discharge 5. OI proph = recommend azithromycin 1200mg  Q sunday 6. Case management = case management start paperwork  for ADAP, in addition, the patient will need to see if can have hospital provide bactrim for PCP treatment. We have given him a 30 day supply of the following medications: darunavir 800mg , ritonavir 100mg , truvada, fluconazole 200mg  and azithromycin and instructions via phone interpreter to how to take his medications. 7. Tobacco abuse- encourage cessation 8. C/o groin pain- defer to outpatient work up by PCP- avoid heavy lifting   Consultations:  ID  Discharge Exam: Filed Vitals:   02/23/12 1820 02/23/12 2110 02/24/12 0202 02/24/12 0554  BP: 105/62 108/68 113/66 117/74  Pulse: 72 82 102 102  Temp: 98 F (36.7 C) 98.5 F (36.9 C) 98.4 F (36.9 C) 97.7 F (36.5 C)  TempSrc: Oral Oral Oral Oral  Resp: 18 20 20 20   Height:      Weight:      SpO2: 99% 100% 99% 100%    General: A+Ox3, NAD Cardiovascular: rrr Respiratory: no wheezing  Discharge Instructions      Discharge Orders    Future Appointments: Provider: Department: Dept Phone: Center:   03/07/2012 1:45 PM Judyann Munson, MD [facility] [phone] [facility]     Future Orders Please Complete By Expires   Diet general      Increase activity slowly      Discharge instructions      Comments:   Avoid heavy lifting until hernias can be addressed as an outpatient Be sure to follow up with appointments Be sure to take medications as prescribed   Discharge instructions  Comments:   Stop smoking       Medication List     As of 02/24/2012  8:19 AM    TAKE these medications         acetaminophen 500 MG tablet   Commonly known as: TYLENOL   Take 1,000 mg by mouth every 6 (six) hours as needed. For pain      azithromycin 600 MG tablet   Commonly known as: ZITHROMAX   Take 2 tablets (1,200 mg total) by mouth once a week.      Darunavir Ethanolate 800 MG tablet   Commonly known as: PREZISTA   Take 1 tablet (800 mg total) by mouth daily with breakfast.       emtricitabine-tenofovir 200-300 MG per tablet   Commonly known as: TRUVADA   Take 1 tablet by mouth daily.      fluconazole 200 MG tablet   Commonly known as: DIFLUCAN   Take 1 tablet (200 mg total) by mouth at bedtime.      oxyCODONE-acetaminophen 5-325 MG per tablet   Commonly known as: PERCOCET/ROXICET   Take 1-2 tablets by mouth every 6 (six) hours as needed.      ritonavir 100 MG Tabs   Commonly known as: NORVIR   Take 1 tablet (100 mg total) by mouth daily with breakfast.      sulfamethoxazole-trimethoprim 400-80 MG per tablet   Commonly known as: BACTRIM,SEPTRA   Take 3 tablets by mouth every 8 (eight) hours.         Follow-up Information    Follow up with Judyann Munson, MD. On 03/07/2012. (appointment time 1:40 pm. Please do not miss your appointment. Please bring financial information to complete ADAP application. If you cannot make your appt, please call the office.  )    Contact information:   [address] [place] [address] [phone]           The results of significant diagnostics from this hospitalization (including imaging, microbiology, ancillary and laboratory) are listed below for reference.    Significant Diagnostic Studies: Dg Chest 2 View  02/18/2012  *RADIOLOGY REPORT*  Clinical Data: Fever and chills.  CHEST - 2 VIEW  Comparison: None.  Findings: There is evidence of multifocal pneumonia with discrete infiltrates present in the right upper lobe and the lingula.  There may also be involvement of the left lower lobe.  There is an associated small right pleural effusion.  No pulmonary edema.  The heart size is normal.  IMPRESSION: Bilateral pneumonia in the right upper lobe and lingula.  There may also be infiltrate in the left lower lobe.  Associated small right pleural effusion is present.   Original Report Authenticated By: Irish Lack, M.D.    Dg Hip Complete Right  02/18/2012  *RADIOLOGY REPORT*  Clinical Data: Pain post  blunt trauma.  RIGHT HIP - COMPLETE 2+ VIEW  Comparison: None.  Findings: Negative for fracture, dislocation, or other acute abnormality.  Normal alignment and mineralization. No significant degenerative change.  Regional soft tissues unremarkable.  IMPRESSION:  Negative   Original Report Authenticated By: D. Andria Rhein, MD    Ct Head Wo Contrast  02/19/2012  *RADIOLOGY REPORT*  Clinical Data:  Injury from fall with frontal headache and posterior neck pain.  CT HEAD WITHOUT CONTRAST CT CERVICAL SPINE WITHOUT CONTRAST  Technique:  Multidetector CT imaging of the head and cervical spine was performed following the standard protocol without intravenous contrast.  Multiplanar CT image reconstructions  of the cervical spine were also generated.  Comparison:   None  CT HEAD  Findings: Technically limited study due to streak artifact likely from motion. The ventricles and sulci are symmetrical without significant effacement, displacement, or dilatation. No mass effect or midline shift. No abnormal extra-axial fluid collections. The grey-white matter junction is distinct. Basal cisterns are not effaced. No acute intracranial hemorrhage. No depressed skull fractures.  Opacification of right ethmoid air cells, likely inflammatory.  Remaining paranasal sinuses and mastoid air cells are not opacified.  IMPRESSION: No acute intracranial abnormalities.  CT CERVICAL SPINE  Findings: Normal alignment of the cervical vertebrae and facet joints.  Lateral masses of C1 are symmetrical.  The odontoid process appears intact.  No vertebral compression deformities. Intervertebral disc space heights are preserved.  No prevertebral soft tissue swelling.  No focal bone lesion or bone destruction. Bone cortex and trabecular architecture appear intact.  IMPRESSION: No displaced fractures identified.   Original Report Authenticated By: Burman Nieves, M.D.    Ct Cervical Spine Wo Contrast  02/19/2012  *RADIOLOGY REPORT*  Clinical Data:   Injury from fall with frontal headache and posterior neck pain.  CT HEAD WITHOUT CONTRAST CT CERVICAL SPINE WITHOUT CONTRAST  Technique:  Multidetector CT imaging of the head and cervical spine was performed following the standard protocol without intravenous contrast.  Multiplanar CT image reconstructions of the cervical spine were also generated.  Comparison:   None  CT HEAD  Findings: Technically limited study due to streak artifact likely from motion. The ventricles and sulci are symmetrical without significant effacement, displacement, or dilatation. No mass effect or midline shift. No abnormal extra-axial fluid collections. The grey-white matter junction is distinct. Basal cisterns are not effaced. No acute intracranial hemorrhage. No depressed skull fractures.  Opacification of right ethmoid air cells, likely inflammatory.  Remaining paranasal sinuses and mastoid air cells are not opacified.  IMPRESSION: No acute intracranial abnormalities.  CT CERVICAL SPINE  Findings: Normal alignment of the cervical vertebrae and facet joints.  Lateral masses of C1 are symmetrical.  The odontoid process appears intact.  No vertebral compression deformities. Intervertebral disc space heights are preserved.  No prevertebral soft tissue swelling.  No focal bone lesion or bone destruction. Bone cortex and trabecular architecture appear intact.  IMPRESSION: No displaced fractures identified.   Original Report Authenticated By: Burman Nieves, M.D.    Dg Knee Complete 4 Views Right  02/18/2012  *RADIOLOGY REPORT*  Clinical Data: Pain post blunt trauma  RIGHT KNEE - COMPLETE 4+ VIEW  Comparison: None.  Findings: No effusion. Negative for fracture, dislocation, or other acute abnormality.  Normal alignment and mineralization. No significant degenerative change.  Regional soft tissues unremarkable.  IMPRESSION:  Negative   Original Report Authenticated By: D. Andria Rhein, MD     Microbiology: Recent Results (from the past  240 hour(s))  URINE CULTURE     Status: Normal   Collection Time   02/18/12  7:56 PM      Component Value Range Status Comment   Specimen Description URINE, RANDOM   Final    Special Requests NONE   Final    Culture  Setup Time 02/18/2012 20:30   Final    Colony Count NO GROWTH   Final    Culture NO GROWTH   Final    Report Status 02/20/2012 FINAL   Final   CULTURE, BLOOD (ROUTINE X 2)     Status: Normal (Preliminary result)   Collection Time   02/20/12 12:55 PM  Component Value Range Status Comment   Specimen Description BLOOD ARM RIGHT   Final    Special Requests BOTTLES DRAWN AEROBIC ONLY 10CC   Final    Culture  Setup Time 02/20/2012 19:18   Final    Culture     Final    Value:        BLOOD CULTURE RECEIVED NO GROWTH TO DATE CULTURE WILL BE HELD FOR 5 DAYS BEFORE ISSUING A FINAL NEGATIVE REPORT   Report Status PENDING   Incomplete   CULTURE, BLOOD (ROUTINE X 2)     Status: Normal (Preliminary result)   Collection Time   02/20/12  1:00 PM      Component Value Range Status Comment   Specimen Description BLOOD HAND RIGHT   Final    Special Requests BOTTLES DRAWN AEROBIC ONLY 10CC   Final    Culture  Setup Time 02/20/2012 19:19   Final    Culture     Final    Value:        BLOOD CULTURE RECEIVED NO GROWTH TO DATE CULTURE WILL BE HELD FOR 5 DAYS BEFORE ISSUING A FINAL NEGATIVE REPORT   Report Status PENDING   Incomplete      Labs: Basic Metabolic Panel:  Lab 02/24/12 4098 02/19/12 0605 02/18/12 1959  NA 137 134* 125*  K 3.7 3.3* 4.0  CL 100 99 91*  CO2 25 24 26   GLUCOSE 96 115* 106*  BUN 5* 8 8  CREATININE 0.76 0.63 0.81  CALCIUM 9.8 8.3* 8.6  MG -- -- --  PHOS -- -- --   Liver Function Tests:  Lab 02/18/12 2307 02/18/12 1959  AST 17 20  ALT 12 13  ALKPHOS 42 49  BILITOT 0.2* 0.2*  PROT 7.3 8.4*  ALBUMIN 2.3* 2.7*   No results found for this basename: LIPASE:5,AMYLASE:5 in the last 168 hours No results found for this basename: AMMONIA:5 in the last 168  hours CBC:  Lab 02/24/12 0630 02/21/12 1132 02/21/12 0930 02/19/12 0605 02/18/12 1959  WBC 3.0* -- 2.1* 7.0 8.8  NEUTROABS -- 1.1* -- -- 7.0  HGB 8.2* -- 7.7* 8.5* 8.0*  HCT 26.3* -- 23.3* 26.5* 24.5*  MCV 81.7 -- 80.6 83.1 82.5  PLT 297 -- 252 228 274   Cardiac Enzymes: No results found for this basename: CKTOTAL:5,CKMB:5,CKMBINDEX:5,TROPONINI:5 in the last 168 hours BNP: BNP (last 3 results) No results found for this basename: PROBNP:3 in the last 8760 hours CBG: No results found for this basename: GLUCAP:5 in the last 168 hours     Signed:  Marlin Canary  [facility] 02/24/2012, 8:19 AM

## 2012-02-24 NOTE — Progress Notes (Signed)
02/24/12 1000 Pt. is eligible for indigent fund.  Pt. has antibiotics and will be filled today prior to dc.   Tera Mater, RN, BSN NCM [phone]

## 2012-02-24 NOTE — Discharge Summary (Signed)
AVS D/C instruction given to pt through spanish interpreter monica who came in this am to explained  to pt how to take his prescribed medication and MD follow up  Medication picked up from Pharmacy . Pt to return on Tuesday for the rest of his bactrum medication. Pt verbalized understanding  . D/C home  And picked up by family  memberand wife . Condition stable.

## 2012-02-25 LAB — HIV 1/2 CONFIRMATION: HIV-1 antibody: POSITIVE

## 2012-02-26 LAB — CULTURE, BLOOD (ROUTINE X 2): Culture: NO GROWTH

## 2012-02-29 ENCOUNTER — Telehealth: Payer: Self-pay | Admitting: *Deleted

## 2012-02-29 NOTE — Telephone Encounter (Signed)
Patient cousin called to find out when his appt is advised paper had 2 appt listed and they are confused. Advised her 03/08/12 at 2 pm.

## 2012-03-07 ENCOUNTER — Inpatient Hospital Stay: Payer: Self-pay | Admitting: Internal Medicine

## 2012-03-08 ENCOUNTER — Other Ambulatory Visit: Payer: Self-pay | Admitting: *Deleted

## 2012-03-08 ENCOUNTER — Telehealth: Payer: Self-pay | Admitting: Internal Medicine

## 2012-03-08 ENCOUNTER — Ambulatory Visit (INDEPENDENT_AMBULATORY_CARE_PROVIDER_SITE_OTHER): Payer: No Typology Code available for payment source | Admitting: Internal Medicine

## 2012-03-08 ENCOUNTER — Ambulatory Visit: Payer: No Typology Code available for payment source

## 2012-03-08 ENCOUNTER — Encounter: Payer: Self-pay | Admitting: Internal Medicine

## 2012-03-08 VITALS — BP 116/78 | HR 102 | Temp 98.3°F | Ht 65.0 in | Wt 129.0 lb

## 2012-03-08 DIAGNOSIS — F329 Major depressive disorder, single episode, unspecified: Secondary | ICD-10-CM

## 2012-03-08 DIAGNOSIS — B20 Human immunodeficiency virus [HIV] disease: Secondary | ICD-10-CM

## 2012-03-08 DIAGNOSIS — F32A Depression, unspecified: Secondary | ICD-10-CM | POA: Insufficient documentation

## 2012-03-08 DIAGNOSIS — Z23 Encounter for immunization: Secondary | ICD-10-CM

## 2012-03-08 MED ORDER — EMTRICITABINE-TENOFOVIR DF 200-300 MG PO TABS: 1.0000 | ORAL_TABLET | Freq: Every day | ORAL | Status: DC

## 2012-03-08 MED ORDER — RITONAVIR 100 MG PO TABS: 100.0000 mg | ORAL_TABLET | Freq: Every day | ORAL | Status: DC

## 2012-03-08 MED ORDER — DARUNAVIR ETHANOLATE 800 MG PO TABS: 800.0000 mg | ORAL_TABLET | Freq: Every day | ORAL | Status: DC

## 2012-03-08 MED ORDER — AZITHROMYCIN 600 MG PO TABS: 1200.0000 mg | ORAL_TABLET | ORAL | Status: DC

## 2012-03-08 MED ORDER — ENSURE PO LIQD: 237.0000 mL | Freq: Two times a day (BID) | ORAL | Status: DC

## 2012-03-08 MED ORDER — TRAMADOL HCL 50 MG PO TABS: 50.0000 mg | ORAL_TABLET | Freq: Four times a day (QID) | ORAL | Status: DC | PRN

## 2012-03-08 MED ORDER — SULFAMETHOXAZOLE-TMP DS 800-160 MG PO TABS: 1.0000 | ORAL_TABLET | Freq: Every day | ORAL | Status: DC

## 2012-03-08 NOTE — Assessment & Plan Note (Addendum)
He has been started and we do have samples of Prezista and norvir and a patient assistance will be filled out for Truvada. He also will be completing the application for ADAP.  I discussed with him the importance of taking his medication. He also understands the nature of the disease and all questions were answered.  He will continue with her opportunistic infection prophylaxis.  More than 45 minutes was patient including counseling, face-to-face contact and coordination of care.

## 2012-03-08 NOTE — Telephone Encounter (Signed)
Called and got an emergency 30 day supply of Truvada today.  Also filling out [employer] application for Ultram.

## 2012-03-08 NOTE — Assessment & Plan Note (Signed)
He does relate some depression he will be sent for evaluation

## 2012-03-08 NOTE — Progress Notes (Signed)
  Subjective:    Patient ID: Herbert Ellison, male    DOB: January 20, 1981, 31 y.o.   MRN: [id number]  HPI He comes in for followup of his hospitalization when he was newly diagnosed with HIV. He has multiple complaints including pain "all over". He also talks about how he has difficulty with transportation and some difficulty with diagnosis. He also describes some hearing voices though denies any suicidal ideation. He was started on Prezista, Norvir and Truvada in the hospital and given a 30 day supply those about to run out. He also completed 21 days of Bactrim for presumed PCP. He does take weekly azithromycin as well. He otherwise has no particular questions regarding HIV.   Review of Systems  Constitutional: Positive for appetite change, fatigue and unexpected weight change. Negative for fever and chills.  HENT: Negative for sore throat and trouble swallowing.   Respiratory: Negative for cough and shortness of breath.   Cardiovascular: Negative for chest pain, palpitations and leg swelling.  Gastrointestinal: Negative for nausea, abdominal pain and diarrhea.  Musculoskeletal: Negative for myalgias, joint swelling and arthralgias.  Skin: Negative for rash.  Neurological: Negative for dizziness and headaches.  Hematological: Negative for adenopathy.       Objective:   Physical Exam  Constitutional: He appears well-developed and well-nourished. No distress.  HENT:  Mouth/Throat: Oropharynx is clear and moist. No oropharyngeal exudate.  Cardiovascular: Normal rate, regular rhythm and normal heart sounds.  Exam reveals no gallop and no friction rub.   No murmur heard. Pulmonary/Chest: Effort normal and breath sounds normal. No respiratory distress. He has no wheezes. He has no rales.  Abdominal: Soft. Bowel sounds are normal. He exhibits no distension. There is no tenderness. There is no rebound.  Lymphadenopathy:    He has no cervical adenopathy.  Skin: No rash noted.           Assessment & Plan:

## 2012-03-09 ENCOUNTER — Telehealth: Payer: Self-pay | Admitting: Internal Medicine

## 2012-03-09 NOTE — Telephone Encounter (Signed)
Faxed application for Ultram to [employer] Patient Assistance today.

## 2012-03-15 ENCOUNTER — Telehealth: Payer: Self-pay | Admitting: Internal Medicine

## 2012-03-15 NOTE — Telephone Encounter (Signed)
Received a letter from [employer].  Mr. Herbert Ellison has been approved for assistance with Ultram (tramadol hydrochloride until 03-09-13.

## 2012-03-20 ENCOUNTER — Other Ambulatory Visit: Payer: Self-pay | Admitting: *Deleted

## 2012-03-20 DIAGNOSIS — B20 Human immunodeficiency virus [HIV] disease: Secondary | ICD-10-CM

## 2012-03-20 MED ORDER — DARUNAVIR ETHANOLATE 800 MG PO TABS: 800.0000 mg | ORAL_TABLET | Freq: Every day | ORAL | Status: DC

## 2012-03-20 MED ORDER — SULFAMETHOXAZOLE-TMP DS 800-160 MG PO TABS: 1.0000 | ORAL_TABLET | Freq: Every day | ORAL | Status: DC

## 2012-03-20 MED ORDER — EMTRICITABINE-TENOFOVIR DF 200-300 MG PO TABS: 1.0000 | ORAL_TABLET | Freq: Every day | ORAL | Status: DC

## 2012-03-20 MED ORDER — RITONAVIR 100 MG PO TABS: 100.0000 mg | ORAL_TABLET | Freq: Every day | ORAL | Status: DC

## 2012-03-20 MED ORDER — AZITHROMYCIN 600 MG PO TABS: 1200.0000 mg | ORAL_TABLET | ORAL | Status: DC

## 2012-03-20 NOTE — Telephone Encounter (Signed)
Patient ADAP was approved. 

## 2012-03-24 ENCOUNTER — Other Ambulatory Visit: Payer: Self-pay | Admitting: *Deleted

## 2012-03-24 DIAGNOSIS — B20 Human immunodeficiency virus [HIV] disease: Secondary | ICD-10-CM

## 2012-03-24 MED ORDER — EMTRICITABINE-TENOFOVIR DF 200-300 MG PO TABS
1.0000 | ORAL_TABLET | Freq: Every day | ORAL | Status: DC
Start: 2012-03-24 — End: 2013-03-26

## 2012-03-24 MED ORDER — SULFAMETHOXAZOLE-TMP DS 800-160 MG PO TABS: 1.0000 | ORAL_TABLET | Freq: Every day | ORAL | Status: DC

## 2012-03-24 MED ORDER — AZITHROMYCIN 600 MG PO TABS: 1200.0000 mg | ORAL_TABLET | ORAL | Status: DC

## 2012-03-24 MED ORDER — DARUNAVIR ETHANOLATE 800 MG PO TABS: 800.0000 mg | ORAL_TABLET | Freq: Every day | ORAL | Status: DC

## 2012-03-24 MED ORDER — RITONAVIR 100 MG PO TABS: 100.0000 mg | ORAL_TABLET | Freq: Every day | ORAL | Status: DC

## 2012-04-03 ENCOUNTER — Other Ambulatory Visit: Payer: Self-pay

## 2012-04-04 ENCOUNTER — Other Ambulatory Visit (INDEPENDENT_AMBULATORY_CARE_PROVIDER_SITE_OTHER): Payer: No Typology Code available for payment source

## 2012-04-04 ENCOUNTER — Other Ambulatory Visit: Payer: Self-pay | Admitting: *Deleted

## 2012-04-04 DIAGNOSIS — B20 Human immunodeficiency virus [HIV] disease: Secondary | ICD-10-CM

## 2012-04-04 LAB — CBC WITH DIFFERENTIAL/PLATELET
Basophils Absolute: 0.1 10*3/uL (ref 0.0–0.1)
Basophils Relative: 1 % (ref 0–1)
Eosinophils Absolute: 0.4 10*3/uL (ref 0.0–0.7)
Eosinophils Relative: 5 % (ref 0–5)
HCT: 36 % — ABNORMAL LOW (ref 39.0–52.0)
Lymphocytes Relative: 40 % (ref 12–46)
MCH: 29.3 pg (ref 26.0–34.0)
MCHC: 34.2 g/dL (ref 30.0–36.0)
MCV: 85.7 fL (ref 78.0–100.0)
Monocytes Absolute: 0.9 10*3/uL (ref 0.1–1.0)
Platelets: 349 10*3/uL (ref 150–400)
RDW: 17.9 % — ABNORMAL HIGH (ref 11.5–15.5)
WBC: 7.2 10*3/uL (ref 4.0–10.5)

## 2012-04-04 LAB — COMPLETE METABOLIC PANEL WITH GFR
ALT: 35 U/L (ref 0–53)
AST: 21 U/L (ref 0–37)
BUN: 9 mg/dL (ref 6–23)
Calcium: 10.4 mg/dL (ref 8.4–10.5)
Chloride: 98 mEq/L (ref 96–112)
Creat: 0.99 mg/dL (ref 0.50–1.35)
Total Bilirubin: 0.2 mg/dL — ABNORMAL LOW (ref 0.3–1.2)

## 2012-04-05 LAB — T-HELPER CELL (CD4) - (RCID CLINIC ONLY)
CD4 % Helper T Cell: 7 % — ABNORMAL LOW (ref 33–55)
CD4 T Cell Abs: 210 uL — ABNORMAL LOW (ref 400–2700)

## 2012-04-05 LAB — HIV-1 RNA QUANT-NO REFLEX-BLD
HIV 1 RNA Quant: 665 {copies}/mL — ABNORMAL HIGH (ref <20)
HIV-1 RNA Quant, Log: 2.82 {Log} — ABNORMAL HIGH (ref <1.30)

## 2012-04-20 ENCOUNTER — Ambulatory Visit: Payer: No Typology Code available for payment source

## 2012-04-20 ENCOUNTER — Other Ambulatory Visit: Payer: Self-pay | Admitting: Licensed Clinical Social Worker

## 2012-04-20 ENCOUNTER — Ambulatory Visit: Payer: Self-pay | Admitting: Internal Medicine

## 2012-05-02 ENCOUNTER — Encounter: Payer: Self-pay | Admitting: Internal Medicine

## 2012-05-02 ENCOUNTER — Ambulatory Visit (INDEPENDENT_AMBULATORY_CARE_PROVIDER_SITE_OTHER): Payer: No Typology Code available for payment source | Admitting: Internal Medicine

## 2012-05-02 ENCOUNTER — Ambulatory Visit: Payer: No Typology Code available for payment source

## 2012-05-02 VITALS — BP 123/82 | HR 111 | Temp 97.7°F | Wt 148.0 lb

## 2012-05-02 DIAGNOSIS — Z23 Encounter for immunization: Secondary | ICD-10-CM

## 2012-05-02 DIAGNOSIS — B20 Human immunodeficiency virus [HIV] disease: Secondary | ICD-10-CM

## 2012-05-02 NOTE — Progress Notes (Signed)
  Subjective:    Patient ID: Herbert Ellison, male    DOB: [birth year], 32 y.o.   MRN: [id number]  HPI He comes in here for his second visit for HIV. He was started on Prezista, Norvir and Truvada during his hospitalization and then completed paperwork for drug assistance program.  He reports excellent compliance and his viral load has decreased and his CD4 count has risen over 200. He does take Bactrim and azithromycin prophylaxis. He does continue to have pain "all over". She otherwise was not able to get the pain medications in the form of tramadol. He otherwise has gained weight and feels better overall though does have some fatigue.   Review of Systems  Constitutional: Positive for fatigue. Negative for fever, chills, activity change, appetite change and unexpected weight change.  HENT: Negative for sore throat and trouble swallowing.   Respiratory: Negative for cough and shortness of breath.   Cardiovascular: Negative for chest pain, palpitations and leg swelling.  Gastrointestinal: Negative for nausea, abdominal pain and diarrhea.  Musculoskeletal: Negative for myalgias, joint swelling and arthralgias.  Skin: Negative for rash.  Neurological: Negative for dizziness and headaches.       Objective:   Physical Exam  Constitutional: He appears well-developed and well-nourished. No distress.  HENT:  Mouth/Throat: Oropharynx is clear and moist. No oropharyngeal exudate.  Cardiovascular: Normal rate, regular rhythm and normal heart sounds.  Exam reveals no gallop and no friction rub.   No murmur heard. Pulmonary/Chest: Effort normal and breath sounds normal. No respiratory distress. He has no wheezes. He has no rales.          Assessment & Plan:

## 2012-05-02 NOTE — Addendum Note (Signed)
Addended by: Andree Coss on: 05/02/2012 04:48 PM   Modules accepted: Orders

## 2012-05-02 NOTE — Assessment & Plan Note (Signed)
He is doing much better with his new regimen and has a viral load under thousand now. CD4 has also increased well. He will continue with same regimen and he can stop taking the weekly is azithromycin. He will continue his Bactrim for now. If his CD4 remains over 200 next visit he can stop the Bactrim. He will return in 3 months. He was given a prescription for tramadol but cannot afford the shipping costs from the drug assistance through the company. He therefore will continue with Tylenol and ibuprofen.

## 2012-05-26 ENCOUNTER — Ambulatory Visit (INDEPENDENT_AMBULATORY_CARE_PROVIDER_SITE_OTHER): Payer: No Typology Code available for payment source | Admitting: *Deleted

## 2012-05-26 DIAGNOSIS — B20 Human immunodeficiency virus [HIV] disease: Secondary | ICD-10-CM

## 2012-05-26 DIAGNOSIS — Z23 Encounter for immunization: Secondary | ICD-10-CM

## 2012-05-30 ENCOUNTER — Ambulatory Visit: Payer: No Typology Code available for payment source

## 2012-06-29 ENCOUNTER — Encounter: Payer: Self-pay | Admitting: *Deleted

## 2012-08-01 ENCOUNTER — Other Ambulatory Visit (INDEPENDENT_AMBULATORY_CARE_PROVIDER_SITE_OTHER): Payer: No Typology Code available for payment source

## 2012-08-01 DIAGNOSIS — B20 Human immunodeficiency virus [HIV] disease: Secondary | ICD-10-CM

## 2012-08-01 LAB — CBC WITH DIFFERENTIAL/PLATELET
Basophils Absolute: 0 10*3/uL (ref 0.0–0.1)
Basophils Relative: 1 % (ref 0–1)
Eosinophils Absolute: 0.1 10*3/uL (ref 0.0–0.7)
Hemoglobin: 14.1 g/dL (ref 13.0–17.0)
MCH: 29.4 pg (ref 26.0–34.0)
MCHC: 34.3 g/dL (ref 30.0–36.0)
Neutro Abs: 2 10*3/uL (ref 1.7–7.7)
Neutrophils Relative %: 53 % (ref 43–77)
Platelets: 292 10*3/uL (ref 150–400)

## 2012-08-01 LAB — COMPLETE METABOLIC PANEL WITH GFR
ALT: 26 U/L (ref 0–53)
CO2: 25 mEq/L (ref 19–32)
Calcium: 9.9 mg/dL (ref 8.4–10.5)
Chloride: 103 mEq/L (ref 96–112)
Creat: 1.08 mg/dL (ref 0.50–1.35)
GFR, Est African American: >89 mL/min
GFR, Est Non African American: >89 mL/min
Glucose, Bld: 96 mg/dL (ref 70–99)
Sodium: 140 mEq/L (ref 135–145)
Total Bilirubin: 0.2 mg/dL — ABNORMAL LOW (ref 0.3–1.2)
Total Protein: 8.8 g/dL — ABNORMAL HIGH (ref 6.0–8.3)

## 2012-08-02 LAB — T-HELPER CELL (CD4) - (RCID CLINIC ONLY)
CD4 % Helper T Cell: 9 % — ABNORMAL LOW (ref 33–55)
CD4 T Cell Abs: 120 uL — ABNORMAL LOW (ref 400–2700)

## 2012-08-03 LAB — HIV-1 RNA QUANT-NO REFLEX-BLD: HIV-1 RNA Quant, Log: <1.3 {Log} (ref <1.30)

## 2012-08-15 ENCOUNTER — Encounter: Payer: Self-pay | Admitting: Internal Medicine

## 2012-08-15 ENCOUNTER — Ambulatory Visit (INDEPENDENT_AMBULATORY_CARE_PROVIDER_SITE_OTHER): Payer: No Typology Code available for payment source | Admitting: Internal Medicine

## 2012-08-15 VITALS — BP 142/88 | HR 91 | Temp 98.3°F | Ht 65.0 in | Wt 180.0 lb

## 2012-08-15 DIAGNOSIS — B354 Tinea corporis: Secondary | ICD-10-CM | POA: Insufficient documentation

## 2012-08-15 DIAGNOSIS — B20 Human immunodeficiency virus [HIV] disease: Secondary | ICD-10-CM

## 2012-08-15 MED ORDER — CLOTRIMAZOLE 1 % EX CREA: TOPICAL_CREAM | Freq: Two times a day (BID) | CUTANEOUS | Status: DC

## 2012-08-15 NOTE — Progress Notes (Signed)
  Subjective:    Patient ID: Herbert Ellison, male    DOB: October 17, 1980, 32 y.o.   MRN: [id number]  HPI Comes infor 042 follow up. Takes his meds daily, denies missed doses.  No wieight loss (has gained).  Stopped taking weekly Azithromycin.  Some musculoskeletal pain in neck.  Right groin rash.     Review of Systems  Constitutional: Negative for fatigue and unexpected weight change.  HENT: Negative for sore throat and trouble swallowing.   Gastrointestinal: Negative for nausea, abdominal pain and diarrhea.  Skin: Positive for rash.       Objective:   Physical Exam  Constitutional: He appears well-developed and well-nourished. No distress.  HENT:  Mouth/Throat: Oropharynx is clear and moist. No oropharyngeal exudate.  Cardiovascular: Normal rate, regular rhythm and normal heart sounds.  Exam reveals no gallop and no friction rub.   No murmur heard. Pulmonary/Chest: Effort normal and breath sounds normal. No respiratory distress. He has no wheezes. He has no rales.  Lymphadenopathy:    He has no cervical adenopathy.  Skin:  + tinea rash on right groin          Assessment & Plan:

## 2012-08-15 NOTE — Assessment & Plan Note (Addendum)
Will try antifungal cream.  Apply bid.  If not that, will try steroid cream.

## 2012-08-15 NOTE — Assessment & Plan Note (Signed)
Doing well but CD 4 is actually down.  Does not appear to be due to compliance issues.  Will recheck in 3 months. Continue Bactrim prophylaxis for now. Off azithromycin prophylaxis.

## 2012-11-07 ENCOUNTER — Other Ambulatory Visit (INDEPENDENT_AMBULATORY_CARE_PROVIDER_SITE_OTHER): Payer: Self-pay

## 2012-11-07 DIAGNOSIS — B20 Human immunodeficiency virus [HIV] disease: Secondary | ICD-10-CM

## 2012-11-07 LAB — CBC WITH DIFFERENTIAL/PLATELET
Eosinophils Absolute: 0.1 10*3/uL (ref 0.0–0.7)
HCT: 42.1 % (ref 39.0–52.0)
Hemoglobin: 14.4 g/dL (ref 13.0–17.0)
Lymphs Abs: 1.5 10*3/uL (ref 0.7–4.0)
MCH: 29.6 pg (ref 26.0–34.0)
Monocytes Relative: 8 % (ref 3–12)
Neutro Abs: 2.7 10*3/uL (ref 1.7–7.7)
Neutrophils Relative %: 58 % (ref 43–77)
RBC: 4.87 MIL/uL (ref 4.22–5.81)

## 2012-11-07 LAB — COMPLETE METABOLIC PANEL WITH GFR
Albumin: 4.5 g/dL (ref 3.5–5.2)
CO2: 29 mEq/L (ref 19–32)
GFR, Est African American: >89 mL/min
GFR, Est Non African American: 80 mL/min
Glucose, Bld: 102 mg/dL — ABNORMAL HIGH (ref 70–99)
Potassium: 4.8 mEq/L (ref 3.5–5.3)
Sodium: 140 mEq/L (ref 135–145)
Total Protein: 7.8 g/dL (ref 6.0–8.3)

## 2012-11-09 ENCOUNTER — Telehealth: Payer: Self-pay | Admitting: *Deleted

## 2012-11-09 ENCOUNTER — Other Ambulatory Visit: Payer: Self-pay
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
## 2012-11-20 ENCOUNTER — Ambulatory Visit: Payer: Self-pay

## 2012-11-23 ENCOUNTER — Ambulatory Visit: Payer: Self-pay

## 2012-11-23 ENCOUNTER — Ambulatory Visit: Payer: Self-pay | Admitting: Internal Medicine

## 2012-12-19 ENCOUNTER — Ambulatory Visit: Payer: Self-pay

## 2012-12-19 ENCOUNTER — Encounter: Payer: Self-pay | Admitting: Internal Medicine

## 2012-12-19 ENCOUNTER — Ambulatory Visit (INDEPENDENT_AMBULATORY_CARE_PROVIDER_SITE_OTHER): Payer: Self-pay | Admitting: Internal Medicine

## 2012-12-19 VITALS — BP 119/78 | HR 65 | Temp 98.3°F | Ht 65.0 in | Wt 175.0 lb

## 2012-12-19 DIAGNOSIS — B20 Human immunodeficiency virus [HIV] disease: Secondary | ICD-10-CM

## 2012-12-19 DIAGNOSIS — Z23 Encounter for immunization: Secondary | ICD-10-CM

## 2012-12-19 DIAGNOSIS — M549 Dorsalgia, unspecified: Secondary | ICD-10-CM | POA: Insufficient documentation

## 2012-12-19 MED ORDER — NAPROXEN 375 MG PO TABS: 375.0000 mg | ORAL_TABLET | Freq: Two times a day (BID) | ORAL | Status: DC

## 2012-12-19 NOTE — Assessment & Plan Note (Signed)
Has had continual pain. Previously he complained of cervical neck pain and now is back pain and shoulder pain associated with lifting. I talked about back hygiene. I have offered him Naprosyn. I explained to him that Percocet is not an ideal therapy for his back pain in particular since he is working with it. He again asked for Percocet but I again redirected him to Naprosyn.

## 2012-12-19 NOTE — Addendum Note (Signed)
Addended by: Wendall Mola A on: 12/19/2012 12:21 PM   Modules accepted: Orders

## 2012-12-19 NOTE — Progress Notes (Signed)
  Subjective:    Patient ID: Herbert Ellison, male    DOB: 1980/11/02, 32 y.o.   MRN: [id number]  HPI  Comes infor 042 follow up. Takes his meds daily, denies missed doses.  No wieight loss.  Plans of shoulder and back pain associated with lifting at work. He is asking for Percocet to relieve the pain which he got after hospitalization. He was previously offered tramadol last year when he complained of pain and was given a prescription for this and did get drug assistance however he was unable or unwilling to pay for the shipping for it. He says he has tried ibuprofen and no relief. He says the only thing that helps is Percocet. He has sore shoulders from lifting at work. No diarrhea, no rash.   Review of Systems  Constitutional: Negative for fatigue and unexpected weight change.  HENT: Negative for sore throat and trouble swallowing.   Eyes: Negative for visual disturbance.  Respiratory: Negative for shortness of breath.   Cardiovascular: Negative for chest pain.  Gastrointestinal: Negative for nausea, abdominal pain and diarrhea.  Musculoskeletal: Positive for myalgias and back pain.  Skin: Positive for rash.  Neurological: Negative for dizziness, light-headedness and headaches.  Hematological: Negative for adenopathy.  Psychiatric/Behavioral: Negative for dysphoric mood.       Objective:   Physical Exam  Constitutional: He is oriented to person, place, and time. He appears well-developed and well-nourished. No distress.  HENT:  Mouth/Throat: Oropharynx is clear and moist. No oropharyngeal exudate.  Eyes: No scleral icterus.  Cardiovascular: Normal rate, regular rhythm and normal heart sounds.  Exam reveals no gallop and no friction rub.   No murmur heard. Pulmonary/Chest: Effort normal and breath sounds normal. No respiratory distress. He has no wheezes. He has no rales.  Lymphadenopathy:    He has no cervical adenopathy.  Neurological: He is alert and oriented to person,  place, and time.  Skin: No rash noted.  Psychiatric: He has a normal mood and affect. His behavior is normal.          Assessment & Plan:

## 2012-12-19 NOTE — Assessment & Plan Note (Signed)
He continues to have slow immune her constitution no good viral suppression. He will continue with the same regimen and Bactrim for prophylaxis.

## 2013-02-13 ENCOUNTER — Encounter: Payer: Self-pay | Admitting: Internal Medicine

## 2013-02-13 ENCOUNTER — Ambulatory Visit (INDEPENDENT_AMBULATORY_CARE_PROVIDER_SITE_OTHER): Payer: Self-pay | Admitting: Internal Medicine

## 2013-02-13 VITALS — BP 109/70 | HR 72 | Temp 98.5°F | Wt 175.0 lb

## 2013-02-13 DIAGNOSIS — F329 Major depressive disorder, single episode, unspecified: Secondary | ICD-10-CM

## 2013-02-13 DIAGNOSIS — M549 Dorsalgia, unspecified: Secondary | ICD-10-CM

## 2013-02-13 MED ORDER — NAPROXEN 375 MG PO TABS: 375.0000 mg | ORAL_TABLET | Freq: Two times a day (BID) | ORAL | Status: DC

## 2013-02-13 MED ORDER — PAROXETINE HCL 10 MG PO TABS: 10.0000 mg | ORAL_TABLET | Freq: Every day | ORAL | Status: DC

## 2013-02-13 NOTE — Assessment & Plan Note (Addendum)
He was given information on establishing with a Field seismologist and the phone number was provided. He is going to call now. I also discussed options for medication and he will start Paxil. We'll have him return in one week.  25 minutes spent with patient including 15 minutes of counseling

## 2013-02-13 NOTE — Assessment & Plan Note (Signed)
He was given Naprosyn

## 2013-02-13 NOTE — Progress Notes (Signed)
  Subjective:    Patient ID: Herbert Ellison, male    DOB: 1980/05/18, 32 y.o.   MRN: [id number]  HPI He comes in for a work in visit. He has had ongoing depression. He tells me he has had difficulty in his relationship with his girlfriend and this recently ended. She has had some difficulty with his children who are teenagers and out of town. He has poor sleep and not able to sleep more than 4 hours a night. Appetite has been poor though no weight loss. No current suicidal ideation. This has been ongoing for several weeks. He continues to take all of his medications without missing any doses.   Review of Systems  Constitutional: Positive for activity change and appetite change. Negative for fatigue.  Eyes: Negative for visual disturbance.  Neurological: Negative for dizziness.  Psychiatric/Behavioral: Positive for sleep disturbance and dysphoric mood. Negative for suicidal ideas. The patient is nervous/anxious.        Objective:   Physical Exam  Constitutional: He is oriented to person, place, and time. He appears well-developed and well-nourished. No distress.  Neurological: He is alert and oriented to person, place, and time.  Psychiatric: His behavior is normal. Judgment and thought content normal.  Flat affect          Assessment & Plan:

## 2013-02-20 ENCOUNTER — Ambulatory Visit (INDEPENDENT_AMBULATORY_CARE_PROVIDER_SITE_OTHER): Payer: Self-pay | Admitting: Internal Medicine

## 2013-02-20 ENCOUNTER — Encounter: Payer: Self-pay | Admitting: Internal Medicine

## 2013-02-20 VITALS — BP 114/71 | HR 76 | Temp 98.0°F | Ht 65.0 in | Wt 174.0 lb

## 2013-02-20 DIAGNOSIS — F329 Major depressive disorder, single episode, unspecified: Secondary | ICD-10-CM

## 2013-02-20 MED ORDER — PAROXETINE HCL 20 MG PO TABS: 20.0000 mg | ORAL_TABLET | Freq: Every day | ORAL | Status: DC

## 2013-02-20 NOTE — Assessment & Plan Note (Signed)
He has had no issues with Paxil and I will try to increase the dose to 20 mg. He understands to call if he has any worsening of his depression or any suicidal thoughts while on the higher dose of Paxil. I also did call and leave a message with the counselor in we'll check with our counselor today to see if we can get through.

## 2013-02-20 NOTE — Progress Notes (Signed)
  Subjective:    Patient ID: Herbert Ellison, male    DOB: 12-09-1980, 32 y.o.   MRN: [id number]  HPI He comes in for followup of depression. She was seen last week and endorsed significant worsening depression though no active suicidal ideation. I started him on low-dose Paxil and he was going to get into a Spanish-speaking counselor however never had his call returns. He otherwise has had no significant worsening on the 10 mg of Paxil and his sleep is unchanged. He does feel the same. Still no suicidal ideation.    Review of Systems  Psychiatric/Behavioral: Positive for sleep disturbance and dysphoric mood. Negative for suicidal ideas.       Objective:   Physical Exam  Constitutional: He appears well-developed and well-nourished.  Skin: No rash noted.  Psychiatric:  Alert, flat affect          Assessment & Plan:

## 2013-03-07 ENCOUNTER — Other Ambulatory Visit: Payer: Self-pay

## 2013-03-20 ENCOUNTER — Ambulatory Visit: Payer: Self-pay | Admitting: Internal Medicine

## 2013-03-20 ENCOUNTER — Telehealth: Payer: Self-pay | Admitting: Internal Medicine

## 2013-03-20 NOTE — Telephone Encounter (Signed)
I called patient regarding his depression.  He has stopped the Paxil since it was not helping.  He did get a psychology appt with someone spanish speaking but when he showed up for the appt, it was closed.  I encouraged him to call back and get back in.  I arranged follow up with me for Jan 8th am.

## 2013-03-22 ENCOUNTER — Other Ambulatory Visit: Payer: Self-pay | Admitting: Internal Medicine

## 2013-03-26 ENCOUNTER — Other Ambulatory Visit: Payer: Self-pay | Admitting: *Deleted

## 2013-03-26 ENCOUNTER — Other Ambulatory Visit: Payer: Self-pay | Admitting: Licensed Clinical Social Worker

## 2013-03-26 DIAGNOSIS — B20 Human immunodeficiency virus [HIV] disease: Secondary | ICD-10-CM

## 2013-03-26 MED ORDER — DARUNAVIR ETHANOLATE 800 MG PO TABS: 800.0000 mg | ORAL_TABLET | Freq: Every day | ORAL | Status: DC

## 2013-03-26 MED ORDER — EMTRICITABINE-TENOFOVIR DF 200-300 MG PO TABS: 1.0000 | ORAL_TABLET | Freq: Every day | ORAL | Status: DC

## 2013-03-26 MED ORDER — RITONAVIR 100 MG PO TABS: 100.0000 mg | ORAL_TABLET | Freq: Every day | ORAL | Status: DC

## 2013-03-26 MED ORDER — SULFAMETHOXAZOLE-TMP DS 800-160 MG PO TABS: 1.0000 | ORAL_TABLET | Freq: Every day | ORAL | Status: DC

## 2013-04-05 ENCOUNTER — Ambulatory Visit (INDEPENDENT_AMBULATORY_CARE_PROVIDER_SITE_OTHER): Payer: Self-pay | Admitting: Internal Medicine

## 2013-04-05 DIAGNOSIS — B20 Human immunodeficiency virus [HIV] disease: Secondary | ICD-10-CM

## 2013-04-06 NOTE — Progress Notes (Signed)
Patient ID: Herbert RangerWilmer Herbert Ellison, male   DOB: 07/06/1980, 33 y.o.   MRN: [id number] Opened in error, no show

## 2013-04-06 NOTE — Assessment & Plan Note (Signed)
error 

## 2013-04-13 ENCOUNTER — Telehealth: Payer: Self-pay | Admitting: *Deleted

## 2013-04-13 NOTE — Telephone Encounter (Signed)
Spoke with patient to reschedule his follow up regarding depression, medication, and counseling.  Patient given appointment with Dr. Luciana Axeomer for 1/27 at 10:45. Andree CossHowell, Marguarite Markov M, RN

## 2013-04-24 ENCOUNTER — Encounter: Payer: Self-pay | Admitting: Internal Medicine

## 2013-04-24 ENCOUNTER — Ambulatory Visit (INDEPENDENT_AMBULATORY_CARE_PROVIDER_SITE_OTHER): Payer: Self-pay | Admitting: Internal Medicine

## 2013-04-24 VITALS — BP 106/74 | HR 61 | Temp 98.2°F | Wt 167.0 lb

## 2013-04-24 DIAGNOSIS — B20 Human immunodeficiency virus [HIV] disease: Secondary | ICD-10-CM

## 2013-04-24 DIAGNOSIS — Z113 Encounter for screening for infections with a predominantly sexual mode of transmission: Secondary | ICD-10-CM

## 2013-04-24 DIAGNOSIS — K0889 Other specified disorders of teeth and supporting structures: Secondary | ICD-10-CM | POA: Insufficient documentation

## 2013-04-24 DIAGNOSIS — F329 Major depressive disorder, single episode, unspecified: Secondary | ICD-10-CM

## 2013-04-24 DIAGNOSIS — F32A Depression, unspecified: Secondary | ICD-10-CM

## 2013-04-24 DIAGNOSIS — F3289 Other specified depressive episodes: Secondary | ICD-10-CM

## 2013-04-24 DIAGNOSIS — K089 Disorder of teeth and supporting structures, unspecified: Secondary | ICD-10-CM

## 2013-04-24 DIAGNOSIS — Z79899 Other long term (current) drug therapy: Secondary | ICD-10-CM

## 2013-04-24 LAB — LIPID PANEL
CHOL/HDL RATIO: 5.5 ratio
CHOLESTEROL: 215 mg/dL — AB (ref 0–200)
HDL: 39 mg/dL — ABNORMAL LOW (ref >39)
LDL CALC: 139 mg/dL — AB (ref 0–99)
TRIGLYCERIDES: 185 mg/dL — AB (ref <150)
VLDL: 37 mg/dL (ref 0–40)

## 2013-04-24 LAB — CBC WITH DIFFERENTIAL/PLATELET
BASOS PCT: 0 % (ref 0–1)
Basophils Absolute: 0 10*3/uL (ref 0.0–0.1)
Eosinophils Absolute: 0.1 10*3/uL (ref 0.0–0.7)
Eosinophils Relative: 3 % (ref 0–5)
HEMATOCRIT: 42.8 % (ref 39.0–52.0)
HEMOGLOBIN: 14.2 g/dL (ref 13.0–17.0)
LYMPHS ABS: 1.5 10*3/uL (ref 0.7–4.0)
Lymphocytes Relative: 35 % (ref 12–46)
MCH: 30.1 pg (ref 26.0–34.0)
MCHC: 33.2 g/dL (ref 30.0–36.0)
MCV: 90.7 fL (ref 78.0–100.0)
MONO ABS: 0.4 10*3/uL (ref 0.1–1.0)
MONOS PCT: 10 % (ref 3–12)
NEUTROS ABS: 2.2 10*3/uL (ref 1.7–7.7)
NEUTROS PCT: 52 % (ref 43–77)
Platelets: 248 10*3/uL (ref 150–400)
RBC: 4.72 MIL/uL (ref 4.22–5.81)
RDW: 14.9 % (ref 11.5–15.5)
WBC: 4.4 10*3/uL (ref 4.0–10.5)

## 2013-04-24 LAB — COMPLETE METABOLIC PANEL WITH GFR
ALK PHOS: 75 U/L (ref 39–117)
ALT: 13 U/L (ref 0–53)
AST: 13 U/L (ref 0–37)
Albumin: 4.2 g/dL (ref 3.5–5.2)
BILIRUBIN TOTAL: 0.3 mg/dL (ref 0.3–1.2)
BUN: 6 mg/dL (ref 6–23)
CO2: 29 mEq/L (ref 19–32)
CREATININE: 1.03 mg/dL (ref 0.50–1.35)
Calcium: 9.5 mg/dL (ref 8.4–10.5)
Chloride: 103 mEq/L (ref 96–112)
GFR, Est Non African American: >89 mL/min
Glucose, Bld: 84 mg/dL (ref 70–99)
Potassium: 4.8 mEq/L (ref 3.5–5.3)
SODIUM: 138 meq/L (ref 135–145)
TOTAL PROTEIN: 7.2 g/dL (ref 6.0–8.3)

## 2013-04-24 MED ORDER — AMOXICILLIN 500 MG PO CAPS: 500.0000 mg | ORAL_CAPSULE | Freq: Three times a day (TID) | ORAL | Status: DC

## 2013-04-24 NOTE — Assessment & Plan Note (Addendum)
Some inflammation in gums, broken tooth.  Amoxicillin for 2 weeks.  Will schedule for dentist.

## 2013-04-24 NOTE — Assessment & Plan Note (Signed)
Improved, encouraged trying to follow up with counselor.

## 2013-04-24 NOTE — Progress Notes (Signed)
  Subjective:    Patient ID: Herbert Ellison, male    DOB: 1981/03/07, 33 y.o.   MRN: [id number]  HPI  Comes infor 042 follow up. Takes his meds daily, denies missed doses.  No wieight loss.  Has been struggling with depression but feels better now.  No SI.  Took Paxil but felt worse and stopped.  Has not been able to coordinate with the spanish-speaking counselor but does feel ok now.  Takes his ARVs with no missed doses.  Some dental pain where he has a chipped tooth   Review of Systems  Constitutional: Negative for fatigue and unexpected weight change.  HENT: Negative for sore throat and trouble swallowing.   Eyes: Negative for visual disturbance.  Respiratory: Negative for shortness of breath.   Cardiovascular: Negative for chest pain.  Gastrointestinal: Negative for nausea, abdominal pain and diarrhea.  Skin: Negative for rash.  Neurological: Negative for dizziness, light-headedness and headaches.  Hematological: Negative for adenopathy.  Psychiatric/Behavioral: Positive for dysphoric mood. Negative for suicidal ideas and sleep disturbance.       Improved mood       Objective:   Physical Exam  Constitutional: He is oriented to person, place, and time. He appears well-developed and well-nourished. No distress.  HENT:  Mouth/Throat: Oropharynx is clear and moist. No oropharyngeal exudate.  Eyes: No scleral icterus.  Cardiovascular: Normal rate, regular rhythm and normal heart sounds.  Exam reveals no gallop and no friction rub.   No murmur heard. Pulmonary/Chest: Effort normal and breath sounds normal. No respiratory distress. He has no wheezes. He has no rales.  Lymphadenopathy:    He has no cervical adenopathy.  Neurological: He is alert and oriented to person, place, and time.  Skin: No rash noted.  Psychiatric: He has a normal mood and affect. His behavior is normal.          Assessment & Plan:

## 2013-04-24 NOTE — Assessment & Plan Note (Signed)
Doing well.  Will check labs today and rtc in 3 months, sooner if labs not good.

## 2013-04-25 LAB — HIV-1 RNA QUANT-NO REFLEX-BLD: HIV 1 RNA Quant: <20 copies/mL (ref <20)

## 2013-04-25 LAB — RPR

## 2013-04-25 LAB — T-HELPER CELL (CD4) - (RCID CLINIC ONLY)
CD4 % Helper T Cell: 16 % — ABNORMAL LOW (ref 33–55)
CD4 T Cell Abs: 260 /uL — ABNORMAL LOW (ref 400–2700)

## 2013-05-17 ENCOUNTER — Ambulatory Visit: Payer: Self-pay

## 2013-07-24 ENCOUNTER — Encounter: Payer: Self-pay | Admitting: Internal Medicine

## 2013-07-24 ENCOUNTER — Ambulatory Visit (INDEPENDENT_AMBULATORY_CARE_PROVIDER_SITE_OTHER): Payer: Self-pay | Admitting: Internal Medicine

## 2013-07-24 VITALS — BP 118/75 | HR 81 | Temp 98.7°F | Ht 66.0 in | Wt 175.0 lb

## 2013-07-24 DIAGNOSIS — B20 Human immunodeficiency virus [HIV] disease: Secondary | ICD-10-CM

## 2013-07-24 DIAGNOSIS — F17200 Nicotine dependence, unspecified, uncomplicated: Secondary | ICD-10-CM

## 2013-07-24 DIAGNOSIS — F3289 Other specified depressive episodes: Secondary | ICD-10-CM

## 2013-07-24 DIAGNOSIS — F17210 Nicotine dependence, cigarettes, uncomplicated: Secondary | ICD-10-CM

## 2013-07-24 DIAGNOSIS — F32A Depression, unspecified: Secondary | ICD-10-CM

## 2013-07-24 DIAGNOSIS — F329 Major depressive disorder, single episode, unspecified: Secondary | ICD-10-CM

## 2013-07-24 LAB — CBC WITH DIFFERENTIAL/PLATELET
Basophils Absolute: 0 10*3/uL (ref 0.0–0.1)
Basophils Relative: 0 % (ref 0–1)
EOS ABS: 0.1 10*3/uL (ref 0.0–0.7)
EOS PCT: 2 % (ref 0–5)
HEMATOCRIT: 40.2 % (ref 39.0–52.0)
Hemoglobin: 13.7 g/dL (ref 13.0–17.0)
Lymphocytes Relative: 22 % (ref 12–46)
Lymphs Abs: 1.5 10*3/uL (ref 0.7–4.0)
MCH: 29.9 pg (ref 26.0–34.0)
MCHC: 34.1 g/dL (ref 30.0–36.0)
MCV: 87.8 fL (ref 78.0–100.0)
Monocytes Absolute: 0.4 10*3/uL (ref 0.1–1.0)
Monocytes Relative: 6 % (ref 3–12)
Neutro Abs: 4.8 10*3/uL (ref 1.7–7.7)
Neutrophils Relative %: 70 % (ref 43–77)
Platelets: 222 10*3/uL (ref 150–400)
RBC: 4.58 MIL/uL (ref 4.22–5.81)
RDW: 14.2 % (ref 11.5–15.5)
WBC: 6.8 10*3/uL (ref 4.0–10.5)

## 2013-07-24 LAB — COMPLETE METABOLIC PANEL WITH GFR
ALT: 11 U/L (ref 0–53)
AST: 13 U/L (ref 0–37)
Albumin: 4.7 g/dL (ref 3.5–5.2)
Alkaline Phosphatase: 79 U/L (ref 39–117)
BILIRUBIN TOTAL: 0.2 mg/dL (ref 0.2–1.2)
BUN: 9 mg/dL (ref 6–23)
CO2: 28 mEq/L (ref 19–32)
CREATININE: 1.19 mg/dL (ref 0.50–1.35)
Calcium: 9.8 mg/dL (ref 8.4–10.5)
Chloride: 102 mEq/L (ref 96–112)
GFR, Est African American: >89 mL/min
GFR, Est Non African American: 79 mL/min
Glucose, Bld: 129 mg/dL — ABNORMAL HIGH (ref 70–99)
Potassium: 4.1 mEq/L (ref 3.5–5.3)
Sodium: 141 mEq/L (ref 135–145)
Total Protein: 7.4 g/dL (ref 6.0–8.3)

## 2013-07-24 MED ORDER — DARUNAVIR-COBICISTAT 800-150 MG PO TABS: 1.0000 | ORAL_TABLET | Freq: Every day | ORAL | Status: DC

## 2013-07-24 NOTE — Assessment & Plan Note (Signed)
precontemplative. 

## 2013-07-24 NOTE — Progress Notes (Signed)
  Subjective:    Patient ID: Herbert Ellison, male    DOB: 1981/03/06, 33 y.o.   MRN: [id number]  HPI  Comes infor 042 follow up. Takes his meds daily, denies missed doses.  No wieight loss.  Has been struggling with depression but feels better now.  No SI.  Took Paxil but felt worse and stopped.  Doesn't feel like he needs counseling now.  Had some neck pain for years but better.  Takes his ARVs with no missed doses.    Review of Systems  Constitutional: Negative for fatigue and unexpected weight change.  HENT: Negative for sore throat and trouble swallowing.   Eyes: Negative for visual disturbance.  Respiratory: Negative for shortness of breath.   Cardiovascular: Negative for chest pain.  Gastrointestinal: Negative for nausea, abdominal pain and diarrhea.  Skin: Negative for rash.  Neurological: Negative for dizziness, light-headedness and headaches.  Hematological: Negative for adenopathy.  Psychiatric/Behavioral: Positive for dysphoric mood. Negative for suicidal ideas and sleep disturbance.       Improved mood       Objective:   Physical Exam  Constitutional: He is oriented to person, place, and time. He appears well-developed and well-nourished. No distress.  HENT:  Mouth/Throat: Oropharynx is clear and moist. No oropharyngeal exudate.  Eyes: No scleral icterus.  Cardiovascular: Normal rate, regular rhythm and normal heart sounds.  Exam reveals no gallop and no friction rub.   No murmur heard. Pulmonary/Chest: Effort normal and breath sounds normal. No respiratory distress. He has no wheezes. He has no rales.  Lymphadenopathy:    He has no cervical adenopathy.  Neurological: He is alert and oriented to person, place, and time.  Skin: No rash noted.  Psychiatric: He has a normal mood and affect. His behavior is normal.          Assessment & Plan:

## 2013-07-24 NOTE — Assessment & Plan Note (Signed)
Feels better, does not want medication.

## 2013-07-24 NOTE — Assessment & Plan Note (Signed)
Labs today.  Doing well. RTC in 4 months unless concerns.  Worried about work schedule, may just get labs next time if he is unable to get off from work.

## 2013-07-25 ENCOUNTER — Encounter: Payer: Self-pay | Admitting: Licensed Clinical Social Worker

## 2013-07-25 NOTE — Progress Notes (Signed)
CM- Herbert Ellison assigned to CT until 03/2014 

## 2013-07-26 LAB — T-HELPER CELL (CD4) - (RCID CLINIC ONLY)
CD4 % Helper T Cell: 18 % — ABNORMAL LOW (ref 33–55)
CD4 T Cell Abs: 340 /uL — ABNORMAL LOW (ref 400–2700)

## 2013-07-26 LAB — HIV-1 RNA QUANT-NO REFLEX-BLD
HIV 1 RNA QUANT: 59 {copies}/mL — AB (ref <20)
HIV-1 RNA QUANT, LOG: 1.77 {Log} — AB (ref <1.30)

## 2013-08-01 ENCOUNTER — Encounter: Payer: Self-pay | Admitting: Licensed Clinical Social Worker

## 2013-08-01 NOTE — Progress Notes (Signed)
CM-Herbert Ellison assigned to Ct

## 2013-10-29 ENCOUNTER — Other Ambulatory Visit: Payer: Self-pay | Admitting: Internal Medicine

## 2013-11-28 ENCOUNTER — Ambulatory Visit: Payer: Self-pay | Admitting: Internal Medicine

## 2014-01-22 ENCOUNTER — Ambulatory Visit (INDEPENDENT_AMBULATORY_CARE_PROVIDER_SITE_OTHER): Payer: Self-pay | Admitting: Internal Medicine

## 2014-01-22 ENCOUNTER — Encounter: Payer: Self-pay | Admitting: Internal Medicine

## 2014-01-22 VITALS — BP 112/75 | HR 58 | Temp 97.7°F | Wt 164.0 lb

## 2014-01-22 DIAGNOSIS — F329 Major depressive disorder, single episode, unspecified: Secondary | ICD-10-CM

## 2014-01-22 DIAGNOSIS — Z23 Encounter for immunization: Secondary | ICD-10-CM

## 2014-01-22 DIAGNOSIS — F32A Depression, unspecified: Secondary | ICD-10-CM

## 2014-01-22 DIAGNOSIS — B20 Human immunodeficiency virus [HIV] disease: Secondary | ICD-10-CM

## 2014-01-22 LAB — CBC WITH DIFFERENTIAL/PLATELET
Basophils Absolute: 0 10*3/uL (ref 0.0–0.1)
Basophils Relative: 1 % (ref 0–1)
EOS ABS: 0.1 10*3/uL (ref 0.0–0.7)
Eosinophils Relative: 3 % (ref 0–5)
HEMATOCRIT: 39.7 % (ref 39.0–52.0)
HEMOGLOBIN: 14 g/dL (ref 13.0–17.0)
LYMPHS ABS: 1.7 10*3/uL (ref 0.7–4.0)
Lymphocytes Relative: 41 % (ref 12–46)
MCH: 29.9 pg (ref 26.0–34.0)
MCHC: 35.3 g/dL (ref 30.0–36.0)
MCV: 84.8 fL (ref 78.0–100.0)
MONO ABS: 0.4 10*3/uL (ref 0.1–1.0)
MONOS PCT: 9 % (ref 3–12)
Neutro Abs: 1.9 10*3/uL (ref 1.7–7.7)
Neutrophils Relative %: 46 % (ref 43–77)
Platelets: 275 10*3/uL (ref 150–400)
RBC: 4.68 MIL/uL (ref 4.22–5.81)
RDW: 13.8 % (ref 11.5–15.5)
WBC: 4.2 10*3/uL (ref 4.0–10.5)

## 2014-01-22 LAB — COMPLETE METABOLIC PANEL WITH GFR
ALBUMIN: 4.5 g/dL (ref 3.5–5.2)
ALT: 9 U/L (ref 0–53)
AST: 13 U/L (ref 0–37)
Alkaline Phosphatase: 69 U/L (ref 39–117)
BUN: 9 mg/dL (ref 6–23)
CALCIUM: 9.9 mg/dL (ref 8.4–10.5)
CHLORIDE: 104 meq/L (ref 96–112)
CO2: 28 meq/L (ref 19–32)
Creat: 1.29 mg/dL (ref 0.50–1.35)
GFR, EST AFRICAN AMERICAN: 82 mL/min
GFR, EST NON AFRICAN AMERICAN: 71 mL/min
GLUCOSE: 84 mg/dL (ref 70–99)
POTASSIUM: 5 meq/L (ref 3.5–5.3)
Sodium: 140 mEq/L (ref 135–145)
Total Bilirubin: 0.4 mg/dL (ref 0.2–1.2)
Total Protein: 7.2 g/dL (ref 6.0–8.3)

## 2014-01-22 MED ORDER — ESCITALOPRAM OXALATE 10 MG PO TABS: 10.0000 mg | ORAL_TABLET | Freq: Every day | ORAL | Status: DC

## 2014-01-22 NOTE — Assessment & Plan Note (Signed)
Will try Lexapro.  RTC 2 weeks to check effects.  Also will refer for PCP to help manage.

## 2014-01-22 NOTE — Progress Notes (Signed)
  Subjective:    Patient ID: Herbert Ellison, male    DOB: Jan 02, 1981, 33 y.o.   MRN: [id number]  HPI Comes infor 042 follow up. Takes his meds daily, denies missed doses.  No wieight loss.  Has been struggling with depression and anxiety and feels he is now ready to try medication again.  No SI.  Took Paxil but felt worse and stopped previously.  Had some neck pain and back pain with movement.  Takes his ARVs with no missed doses. Takes Prezcobix without food sometimes but no side effects.     Review of Systems  Constitutional: Negative for fatigue and unexpected weight change.  HENT: Negative for sore throat and trouble swallowing.   Eyes: Negative for visual disturbance.  Respiratory: Negative for shortness of breath.   Cardiovascular: Negative for chest pain.  Gastrointestinal: Negative for nausea, abdominal pain and diarrhea.  Skin: Negative for rash.  Neurological: Negative for dizziness, light-headedness and headaches.  Hematological: Negative for adenopathy.  Psychiatric/Behavioral: Positive for dysphoric mood. Negative for suicidal ideas and sleep disturbance.       Improved mood       Objective:   Physical Exam  Constitutional: He is oriented to person, place, and time. He appears well-developed and well-nourished. No distress.  HENT:  Mouth/Throat: Oropharynx is clear and moist. No oropharyngeal exudate.  Eyes: No scleral icterus.  Cardiovascular: Normal rate, regular rhythm and normal heart sounds.  Exam reveals no gallop and no friction rub.   No murmur heard. Pulmonary/Chest: Effort normal and breath sounds normal. No respiratory distress. He has no wheezes. He has no rales.  Lymphadenopathy:    He has no cervical adenopathy.  Neurological: He is alert and oriented to person, place, and time.  Skin: No rash noted.  Psychiatric: He has a normal mood and affect. His behavior is normal.          Assessment & Plan:

## 2014-01-22 NOTE — Assessment & Plan Note (Signed)
Doing well, labs today and rtc 4 months unless concerns.

## 2014-01-23 ENCOUNTER — Telehealth: Payer: Self-pay | Admitting: General Practice

## 2014-01-23 LAB — HIV-1 RNA QUANT-NO REFLEX-BLD
HIV 1 RNA Quant: <20 copies/mL (ref <20)
HIV-1 RNA Quant, Log: <1.3 {Log} (ref <1.30)

## 2014-01-23 LAB — T-HELPER CELL (CD4) - (RCID CLINIC ONLY)
CD4 T CELL HELPER: 19 % — AB (ref 33–55)
CD4 T Cell Abs: 320 /uL — ABNORMAL LOW (ref 400–2700)

## 2014-01-23 NOTE — Telephone Encounter (Signed)
Left message for patient to call to schedule appointment to establish care.  °

## 2014-02-05 ENCOUNTER — Ambulatory Visit: Payer: Self-pay | Admitting: Internal Medicine

## 2014-03-01 ENCOUNTER — Other Ambulatory Visit: Payer: Self-pay | Admitting: *Deleted

## 2014-03-01 DIAGNOSIS — B20 Human immunodeficiency virus [HIV] disease: Secondary | ICD-10-CM

## 2014-03-01 MED ORDER — EMTRICITABINE-TENOFOVIR DF 200-300 MG PO TABS: 1.0000 | ORAL_TABLET | Freq: Every day | ORAL | Status: DC

## 2014-03-01 MED ORDER — DARUNAVIR-COBICISTAT 800-150 MG PO TABS: 1.0000 | ORAL_TABLET | Freq: Every day | ORAL | Status: DC

## 2014-05-19 IMAGING — CT CT HEAD W/O CM
4 of 7 series · 16 of 40 positions shown, 18 images · non-contrast
Comparison: None

CT HEAD

CLINICAL DATA: Injury from fall with frontal headache and
posterior neck pain.

CT HEAD WITHOUT CONTRAST
CT CERVICAL SPINE WITHOUT CONTRAST
TECHNIQUE: Multidetector CT imaging of the head and cervical spine
was performed following the standard protocol without intravenous
contrast.  Multiplanar CT image reconstructions of the cervical
spine were also generated.

[Series 3: recon 2: brain · axial · 0.47mm/px · z∈[-85,+2]mm · 4 of 72 slices shown]
[im 15/72  brain]
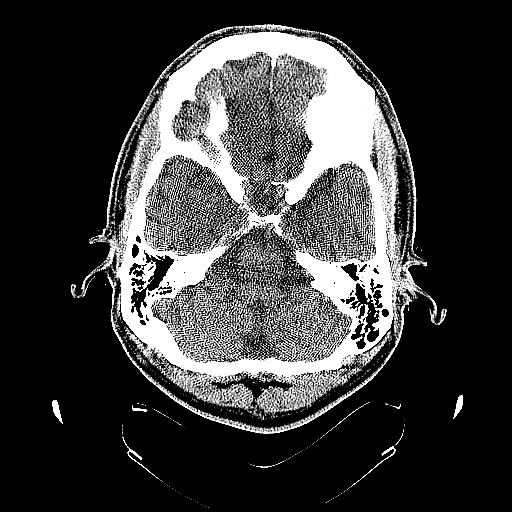
[im 29/72  brain]
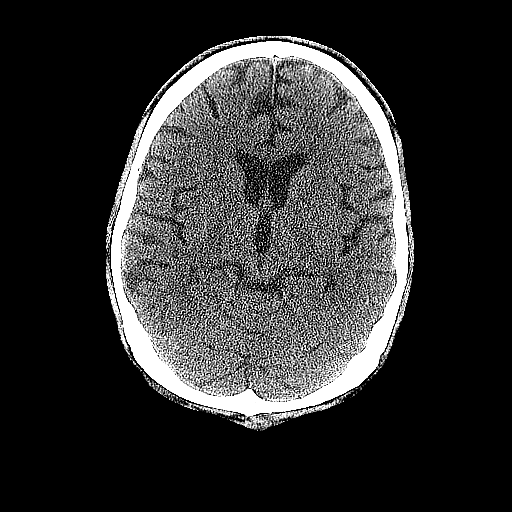
[im 43/72  brain]
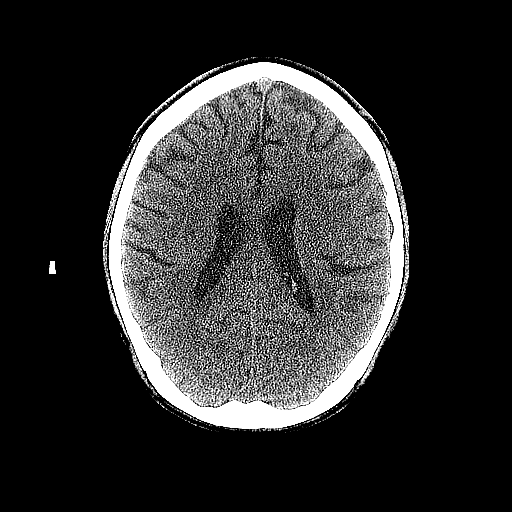
[im 57/72  brain]
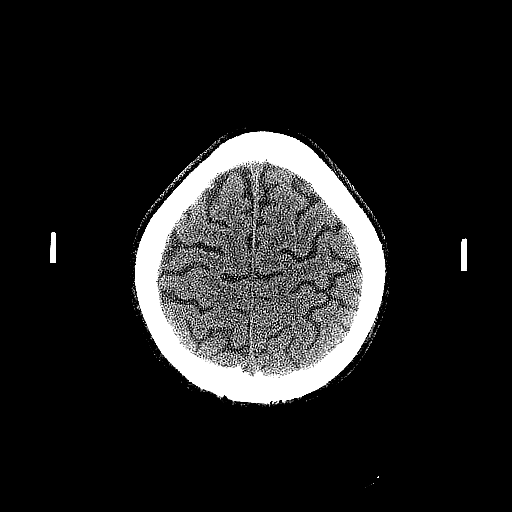

[Series 4: cervical spine · axial · 0.33mm/px · z∈[-256,-146]mm · 4 of 74 slices shown]
[im 15/74  brain]
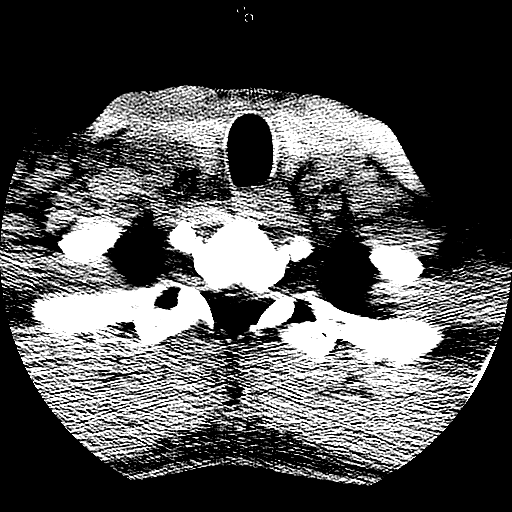
[im 30/74  brain]
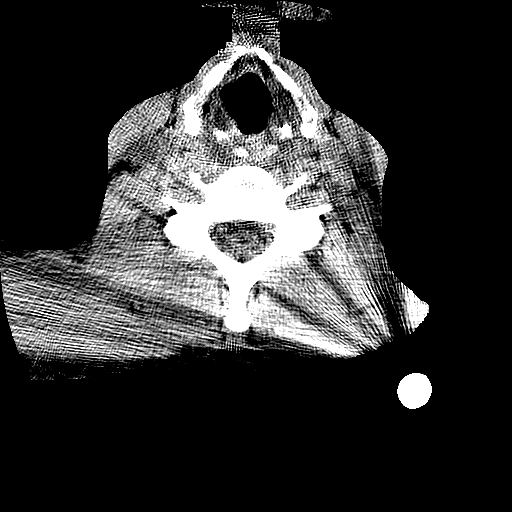
[im 44/74  brain]
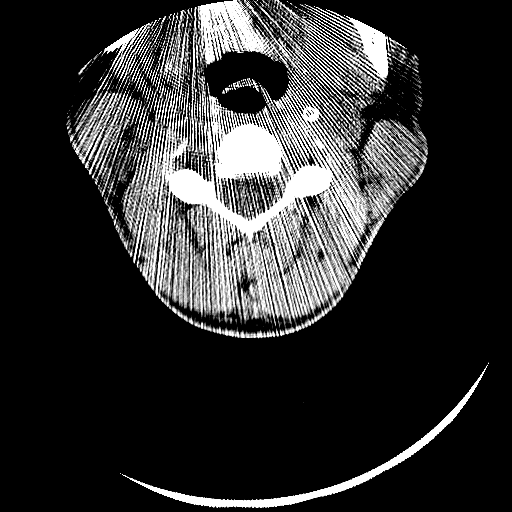
[im 59/74  brain]
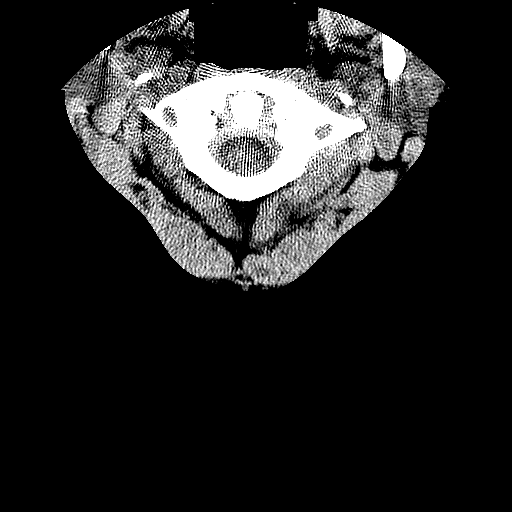

[Series 106: coronal · coronal · 0.37mm/px · 3 of 50 slices shown]
[im 17/50  brain]
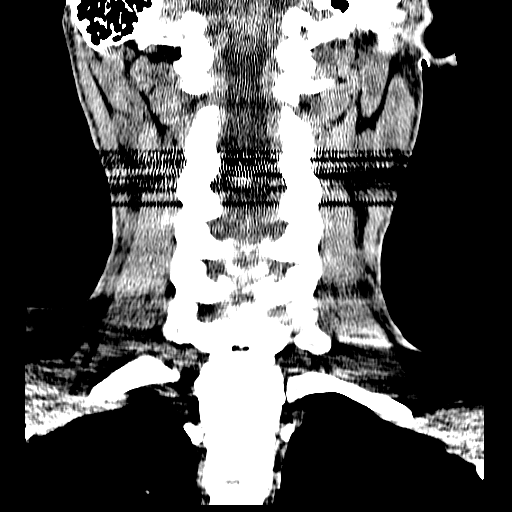
[im 22/50  brain]
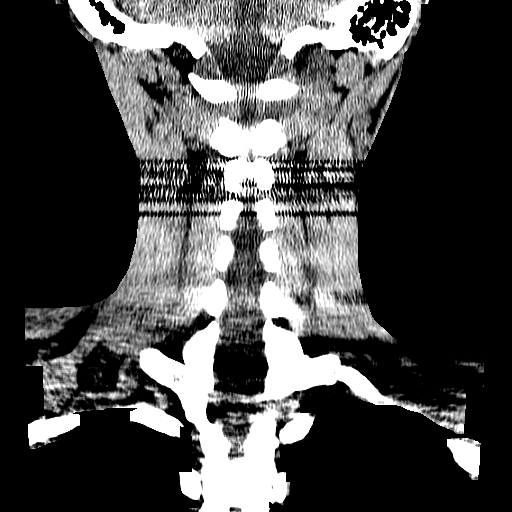
[im 28/50  brain]
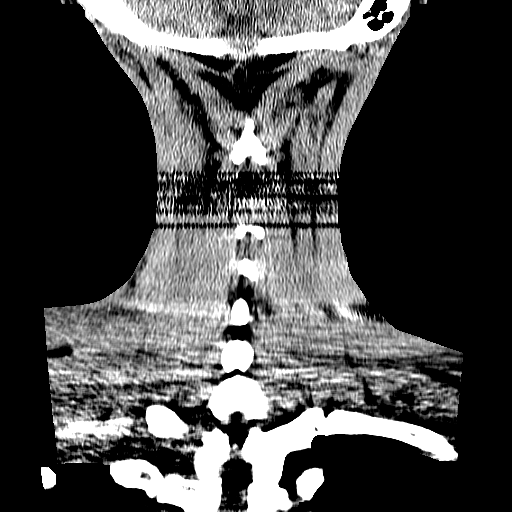

[Series 107: orthog · axial · 0.37mm/px · z∈[-279,-169]mm · 5 of 89 slices shown, 7 images]
[im 15/89  brain]
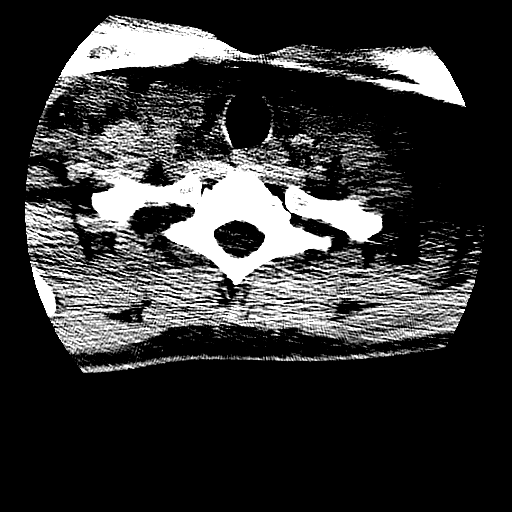
[im 15/89  bone]
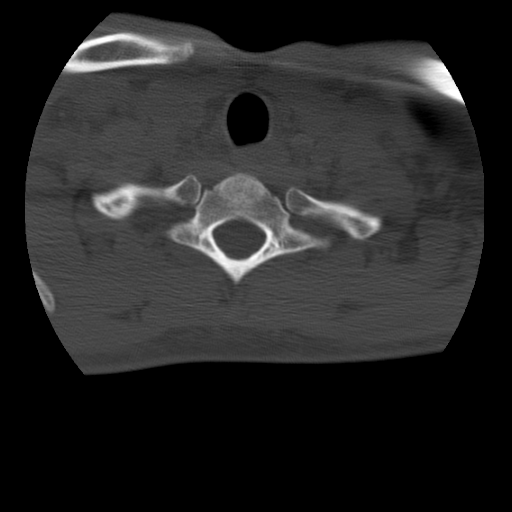
[im 30/89  brain]
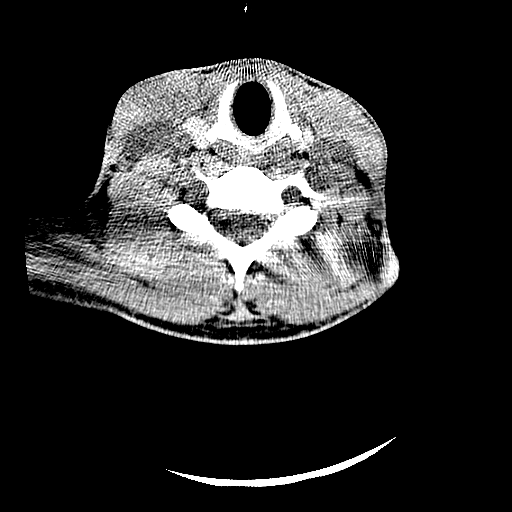
[im 45/89  brain]
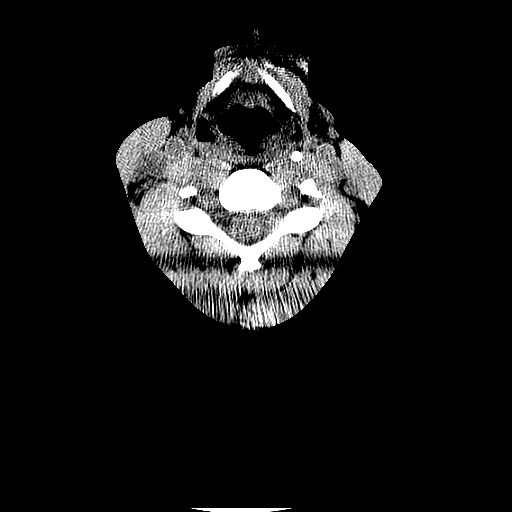
[im 59/89  brain]
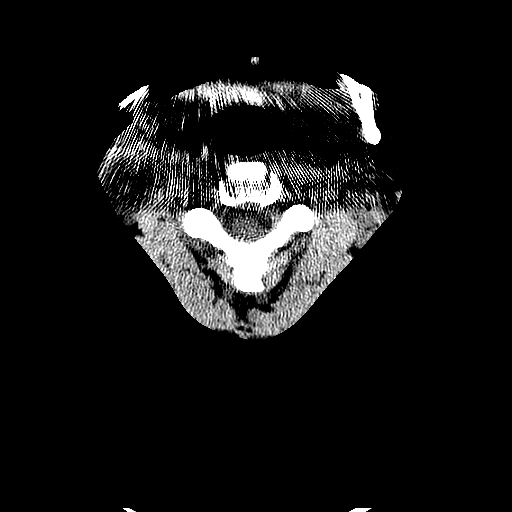
[im 74/89  brain]
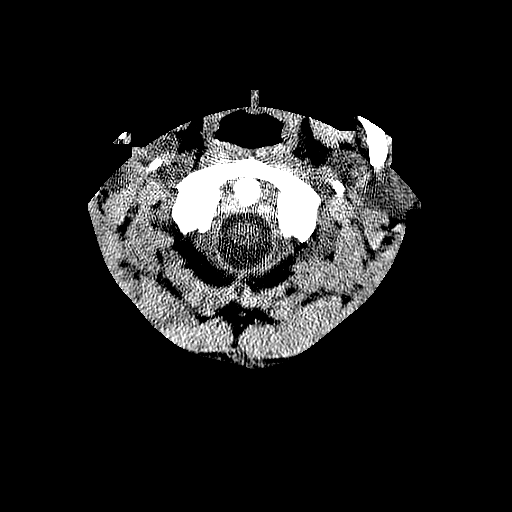
[im 74/89  bone]
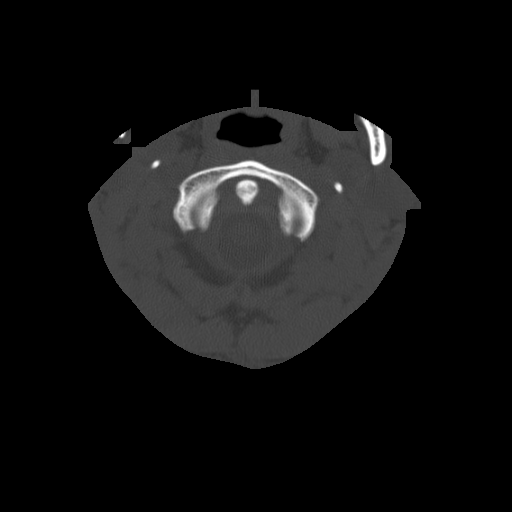

[16 of 40 positions shown; findings below may reference images not displayed]

FINDINGS: Technically limited study due to streak artifact likely
from motion. The ventricles and sulci are symmetrical without
significant effacement, displacement, or dilatation. No mass effect
or midline shift. No abnormal extra-axial fluid collections. The
grey-white matter junction is distinct. Basal cisterns are not
effaced. No acute intracranial hemorrhage. No depressed skull
fractures.  Opacification of right ethmoid air cells, likely
inflammatory.  Remaining paranasal sinuses and mastoid air cells
are not opacified.
IMPRESSION: No acute intracranial abnormalities.

CT CERVICAL SPINE
FINDINGS: Normal alignment of the cervical vertebrae and facet
joints.  Lateral masses of C1 are symmetrical.  The odontoid
process appears intact.  No vertebral compression deformities.
Intervertebral disc space heights are preserved.  No prevertebral
soft tissue swelling.  No focal bone lesion or bone destruction.
Bone cortex and trabecular architecture appear intact.
IMPRESSION: No displaced fractures identified.

## 2014-05-23 ENCOUNTER — Ambulatory Visit (INDEPENDENT_AMBULATORY_CARE_PROVIDER_SITE_OTHER): Payer: Self-pay | Admitting: Internal Medicine

## 2014-05-23 ENCOUNTER — Encounter: Payer: Self-pay | Admitting: Internal Medicine

## 2014-05-23 VITALS — BP 134/87 | HR 44 | Temp 98.0°F | Ht 65.0 in | Wt 159.5 lb

## 2014-05-23 DIAGNOSIS — Z113 Encounter for screening for infections with a predominantly sexual mode of transmission: Secondary | ICD-10-CM

## 2014-05-23 DIAGNOSIS — Z79899 Other long term (current) drug therapy: Secondary | ICD-10-CM

## 2014-05-23 DIAGNOSIS — B20 Human immunodeficiency virus [HIV] disease: Secondary | ICD-10-CM

## 2014-05-23 LAB — CBC WITH DIFFERENTIAL/PLATELET
BASOS ABS: 0 10*3/uL (ref 0.0–0.1)
BASOS PCT: 1 % (ref 0–1)
EOS ABS: 0.1 10*3/uL (ref 0.0–0.7)
Eosinophils Relative: 3 % (ref 0–5)
HCT: 38.6 % — ABNORMAL LOW (ref 39.0–52.0)
Hemoglobin: 12.7 g/dL — ABNORMAL LOW (ref 13.0–17.0)
Lymphocytes Relative: 43 % (ref 12–46)
Lymphs Abs: 1.4 10*3/uL (ref 0.7–4.0)
MCH: 29.3 pg (ref 26.0–34.0)
MCHC: 32.9 g/dL (ref 30.0–36.0)
MCV: 88.9 fL (ref 78.0–100.0)
MONOS PCT: 8 % (ref 3–12)
MPV: 10.3 fL (ref 8.6–12.4)
Monocytes Absolute: 0.3 10*3/uL (ref 0.1–1.0)
NEUTROS PCT: 45 % (ref 43–77)
Neutro Abs: 1.5 10*3/uL — ABNORMAL LOW (ref 1.7–7.7)
PLATELETS: 230 10*3/uL (ref 150–400)
RBC: 4.34 MIL/uL (ref 4.22–5.81)
RDW: 13.9 % (ref 11.5–15.5)
WBC: 3.3 10*3/uL — ABNORMAL LOW (ref 4.0–10.5)

## 2014-05-23 NOTE — Assessment & Plan Note (Signed)
He is doing well on his regimen will continue. He was off about one month ago for a week and so hopefully his labs today will be reassuring. If there are any issues he'll be called otherwise he can return in 4 months.

## 2014-05-23 NOTE — Progress Notes (Signed)
  Subjective:    Patient ID: Herbert Ellison, male    DOB: 06-02-1980, 34 y.o.   MRN: [id number]  HPI Comes infor 042 follow up. Takes his meds daily, denies missed doses.  No wieight loss though hoping to gain weight. Takes his ARVs with no missed doses. Takes Prezcobix without food sometimes but no side effects.  Missed a week when he didn't have transportation. Is thinking of moving to [place] where his bother lives in June and asking about moving.     Review of Systems  Constitutional: Negative for fatigue and unexpected weight change.  HENT: Negative for sore throat and trouble swallowing.   Eyes: Negative for visual disturbance.  Respiratory: Negative for shortness of breath.   Cardiovascular: Negative for chest pain.  Gastrointestinal: Negative for nausea, abdominal pain and diarrhea.  Skin: Negative for rash.  Neurological: Negative for dizziness, light-headedness and headaches.  Hematological: Negative for adenopathy.  Psychiatric/Behavioral: Negative for suicidal ideas, sleep disturbance and dysphoric mood.       Objective:   Physical Exam  Constitutional: He appears well-developed and well-nourished. No distress.  HENT:  Mouth/Throat: Oropharynx is clear and moist. No oropharyngeal exudate.  Eyes: No scleral icterus.  Cardiovascular: Normal rate, regular rhythm and normal heart sounds.  Exam reveals no gallop and no friction rub.   No murmur heard. Pulmonary/Chest: Effort normal and breath sounds normal. No respiratory distress. He has no wheezes. He has no rales.  Lymphadenopathy:    He has no cervical adenopathy.  Skin: No rash noted.  Psychiatric: He has a normal mood and affect. His behavior is normal.          Assessment & Plan:

## 2014-05-24 ENCOUNTER — Other Ambulatory Visit: Payer: Self-pay | Admitting: *Deleted

## 2014-05-24 DIAGNOSIS — B20 Human immunodeficiency virus [HIV] disease: Secondary | ICD-10-CM

## 2014-05-24 LAB — COMPLETE METABOLIC PANEL WITH GFR
ALBUMIN: 4.2 g/dL (ref 3.5–5.2)
ALK PHOS: 58 U/L (ref 39–117)
ALT: 9 U/L (ref 0–53)
AST: 14 U/L (ref 0–37)
BUN: 6 mg/dL (ref 6–23)
CALCIUM: 9.5 mg/dL (ref 8.4–10.5)
CHLORIDE: 105 meq/L (ref 96–112)
CO2: 26 mEq/L (ref 19–32)
Creat: 1 mg/dL (ref 0.50–1.35)
GFR, Est African American: >89 mL/min
GFR, Est Non African American: >89 mL/min
GLUCOSE: 83 mg/dL (ref 70–99)
Potassium: 4.6 mEq/L (ref 3.5–5.3)
Sodium: 141 mEq/L (ref 135–145)
Total Bilirubin: 0.4 mg/dL (ref 0.2–1.2)
Total Protein: 6.9 g/dL (ref 6.0–8.3)

## 2014-05-24 LAB — URINE CYTOLOGY ANCILLARY ONLY
CHLAMYDIA, DNA PROBE: NEGATIVE
Neisseria Gonorrhea: NEGATIVE

## 2014-05-24 LAB — LIPID PANEL
CHOL/HDL RATIO: 5 ratio
CHOLESTEROL: 194 mg/dL (ref 0–200)
HDL: 39 mg/dL — ABNORMAL LOW (ref >=40)
LDL Cholesterol: 138 mg/dL — ABNORMAL HIGH (ref 0–99)
Triglycerides: 84 mg/dL (ref <150)
VLDL: 17 mg/dL (ref 0–40)

## 2014-05-24 LAB — T-HELPER CELL (CD4) - (RCID CLINIC ONLY)
CD4 % Helper T Cell: 20 % — ABNORMAL LOW (ref 33–55)
CD4 T Cell Abs: 270 /uL — ABNORMAL LOW (ref 400–2700)

## 2014-05-24 LAB — RPR

## 2014-05-24 MED ORDER — DARUNAVIR-COBICISTAT 800-150 MG PO TABS: 1.0000 | ORAL_TABLET | Freq: Every day | ORAL | Status: DC

## 2014-05-24 MED ORDER — EMTRICITABINE-TENOFOVIR DF 200-300 MG PO TABS: 1.0000 | ORAL_TABLET | Freq: Every day | ORAL | Status: DC

## 2014-05-24 NOTE — Telephone Encounter (Signed)
ADAP Application 

## 2014-05-26 LAB — HIV-1 RNA QUANT-NO REFLEX-BLD
HIV 1 RNA Quant: <20 copies/mL (ref <20)
HIV-1 RNA Quant, Log: <1.3 {Log} (ref <1.30)

## 2014-08-31 ENCOUNTER — Other Ambulatory Visit: Payer: Self-pay | Admitting: Internal Medicine

## 2014-08-31 DIAGNOSIS — B20 Human immunodeficiency virus [HIV] disease: Secondary | ICD-10-CM

## 2014-09-19 ENCOUNTER — Encounter: Payer: Self-pay | Admitting: Internal Medicine

## 2014-09-19 ENCOUNTER — Ambulatory Visit (INDEPENDENT_AMBULATORY_CARE_PROVIDER_SITE_OTHER): Payer: Self-pay | Admitting: Internal Medicine

## 2014-09-19 VITALS — BP 127/81 | HR 67 | Temp 98.0°F | Ht 65.0 in | Wt 166.0 lb

## 2014-09-19 DIAGNOSIS — F329 Major depressive disorder, single episode, unspecified: Secondary | ICD-10-CM

## 2014-09-19 DIAGNOSIS — M545 Low back pain, unspecified: Secondary | ICD-10-CM

## 2014-09-19 DIAGNOSIS — B20 Human immunodeficiency virus [HIV] disease: Secondary | ICD-10-CM

## 2014-09-19 DIAGNOSIS — F32A Depression, unspecified: Secondary | ICD-10-CM

## 2014-09-19 NOTE — Progress Notes (Signed)
  Subjective:    Patient ID: Herbert Ellison, male    DOB: 22-Aug-1980, 34 y.o.   MRN: [id number]  HPI Comes infor 042 follow up. Takes his meds daily, 1 missed doses.  No wieight loss. On Prezcobix and Truvada.  Continues to be depressed but no SI.  Not interested in medication.  Not interested in therapy.  Still with pain, asking for pain medications.     Review of Systems  Constitutional: Negative for fatigue and unexpected weight change.  HENT: Negative for sore throat and trouble swallowing.   Gastrointestinal: Negative for nausea and diarrhea.  Skin: Negative for rash.  Neurological: Negative for dizziness, light-headedness and headaches.  Psychiatric/Behavioral: Positive for dysphoric mood. Negative for suicidal ideas and sleep disturbance.       Objective:   Physical Exam  Constitutional: He appears well-developed and well-nourished. No distress.  HENT:  Mouth/Throat: Oropharynx is clear and moist. No oropharyngeal exudate.  Eyes: No scleral icterus.  Cardiovascular: Normal rate, regular rhythm and normal heart sounds.  Exam reveals no friction rub.   No murmur heard. Pulmonary/Chest: Effort normal and breath sounds normal. No respiratory distress. He has no wheezes.  Lymphadenopathy:    He has no cervical adenopathy.  Skin: No rash noted.  Psychiatric: He has a normal mood and affect. His behavior is normal.          Assessment & Plan:

## 2014-09-19 NOTE — Assessment & Plan Note (Signed)
Encouraged him to get a PCP.  May benefit from PT or other work up, though not interested at this time.

## 2014-09-19 NOTE — Assessment & Plan Note (Signed)
Doing good.  Labs today and rtc in 3-4 months unless concerns.  Offered and given condoms.

## 2014-09-19 NOTE — Assessment & Plan Note (Signed)
Offered and refused counseling or medications

## 2014-09-20 LAB — T-HELPER CELL (CD4) - (RCID CLINIC ONLY)
CD4 % Helper T Cell: 16 % — ABNORMAL LOW (ref 33–55)
CD4 T Cell Abs: 260 /uL — ABNORMAL LOW (ref 400–2700)

## 2014-09-22 LAB — HIV-1 RNA QUANT-NO REFLEX-BLD
HIV 1 RNA Quant: 23 copies/mL — ABNORMAL HIGH (ref <20)
HIV-1 RNA Quant, Log: 1.36 {Log} — ABNORMAL HIGH (ref <1.30)

## 2014-11-05 ENCOUNTER — Ambulatory Visit: Payer: Self-pay

## 2015-01-10 ENCOUNTER — Other Ambulatory Visit: Payer: Self-pay | Admitting: Internal Medicine

## 2015-01-20 ENCOUNTER — Other Ambulatory Visit: Payer: Self-pay | Admitting: *Deleted

## 2015-01-20 DIAGNOSIS — B20 Human immunodeficiency virus [HIV] disease: Secondary | ICD-10-CM

## 2015-01-20 MED ORDER — DARUNAVIR-COBICISTAT 800-150 MG PO TABS: 1.0000 | ORAL_TABLET | Freq: Every day | ORAL | Status: DC

## 2015-01-20 MED ORDER — EMTRICITABINE-TENOFOVIR DF 200-300 MG PO TABS
1.0000 | ORAL_TABLET | Freq: Every day | ORAL | Status: DC
Start: 2015-01-20 — End: 2015-02-25

## 2015-01-23 ENCOUNTER — Ambulatory Visit: Payer: Self-pay | Admitting: Internal Medicine

## 2015-02-25 ENCOUNTER — Ambulatory Visit (INDEPENDENT_AMBULATORY_CARE_PROVIDER_SITE_OTHER): Payer: Self-pay | Admitting: Internal Medicine

## 2015-02-25 ENCOUNTER — Encounter: Payer: Self-pay | Admitting: Internal Medicine

## 2015-02-25 ENCOUNTER — Other Ambulatory Visit: Payer: Self-pay | Admitting: *Deleted

## 2015-02-25 VITALS — BP 115/72 | HR 69 | Temp 98.3°F | Wt 156.0 lb

## 2015-02-25 DIAGNOSIS — F32A Depression, unspecified: Secondary | ICD-10-CM

## 2015-02-25 DIAGNOSIS — M545 Low back pain, unspecified: Secondary | ICD-10-CM

## 2015-02-25 DIAGNOSIS — B20 Human immunodeficiency virus [HIV] disease: Secondary | ICD-10-CM

## 2015-02-25 DIAGNOSIS — F329 Major depressive disorder, single episode, unspecified: Secondary | ICD-10-CM

## 2015-02-25 DIAGNOSIS — Z23 Encounter for immunization: Secondary | ICD-10-CM

## 2015-02-25 MED ORDER — EMTRICITABINE-TENOFOVIR DF 200-300 MG PO TABS: 1.0000 | ORAL_TABLET | Freq: Every day | ORAL | Status: DC

## 2015-02-25 MED ORDER — DARUNAVIR-COBICISTAT 800-150 MG PO TABS: 1.0000 | ORAL_TABLET | Freq: Every day | ORAL | Status: DC

## 2015-02-25 NOTE — Assessment & Plan Note (Signed)
Stable.  PRN NSAIDS

## 2015-02-25 NOTE — Assessment & Plan Note (Signed)
Stable at this time.  Not interested in addressing it with anything.

## 2015-02-25 NOTE — Progress Notes (Signed)
  Subjective:    Patient ID: Herbert Ellison, male    DOB: 03-Oct-1980, 34 y.o.   MRN: [id number]  HPI Comes in for follow up of HIV.   Takes his Prezcobix and Truvada daily but unfortunately has been out 2 days.  [facility] me [facility] and his [facility] was expired. Turns out that he did not bring in the necessary pay stubs or information needed.  Continues to be depressed but no SI and not interested in therapy.  Not interested in medication.  Still with pain from work.     Review of Systems  Constitutional: Negative for fatigue and unexpected weight change.  HENT: Negative for sore throat and trouble swallowing.   Gastrointestinal: Negative for nausea and diarrhea.  Skin: Negative for rash.  Neurological: Negative for dizziness, light-headedness and headaches.  Psychiatric/Behavioral: Positive for dysphoric mood. Negative for suicidal ideas and sleep disturbance.       Objective:   Physical Exam  Constitutional: He appears well-developed and well-nourished. No distress.  HENT:  Mouth/Throat: Oropharynx is clear and moist. No oropharyngeal exudate.  Eyes: No scleral icterus.  Cardiovascular: Normal rate, regular rhythm and normal heart sounds.  Exam reveals no friction rub.   No murmur heard. Pulmonary/Chest: Effort normal and breath sounds normal. No respiratory distress. He has no wheezes.  Lymphadenopathy:    He has no cervical adenopathy.  Skin: No rash noted.  Psychiatric: He has a normal mood and affect. His behavior is normal.    Social History   Social History  . Marital Status: Single    Spouse Name: N/A  . Number of Children: N/A  . Years of Education: N/A   Occupational History  . Not on file.   Social History Main Topics  . Smoking status: Current Every Day Smoker -- 0.50 packs/day    Types: Cigarettes  . Smokeless tobacco: Never Used     Comment: 5 ciggs daily  . Alcohol Use: No  . Drug Use: 2.00 per week    Special: Marijuana  . Sexual  Activity:    Partners: Female     Comment: given condoms   Other Topics Concern  . Not on file   Social History Narrative        Assessment & Plan:

## 2015-02-25 NOTE — Assessment & Plan Note (Signed)
Will check labs today and find out if he can restart medications now or not.  Will try through [facility] if there is any delay.  RTC 3 months.

## 2015-02-26 LAB — T-HELPER CELL (CD4) - (RCID CLINIC ONLY)
CD4 % Helper T Cell: 21 % — ABNORMAL LOW (ref 33–55)
CD4 T CELL ABS: 370 /uL — AB (ref 400–2700)

## 2015-02-26 LAB — HIV-1 RNA QUANT-NO REFLEX-BLD: HIV 1 RNA Quant: <20 copies/mL (ref <20)

## 2015-03-20 ENCOUNTER — Other Ambulatory Visit: Payer: Self-pay | Admitting: *Deleted

## 2015-03-20 DIAGNOSIS — B20 Human immunodeficiency virus [HIV] disease: Secondary | ICD-10-CM

## 2015-03-20 MED ORDER — DARUNAVIR-COBICISTAT 800-150 MG PO TABS: 1.0000 | ORAL_TABLET | Freq: Every day | ORAL | Status: DC

## 2015-03-20 MED ORDER — EMTRICITABINE-TENOFOVIR DF 200-300 MG PO TABS: 1.0000 | ORAL_TABLET | Freq: Every day | ORAL | Status: DC

## 2015-05-12 ENCOUNTER — Ambulatory Visit: Payer: Self-pay

## 2015-06-24 ENCOUNTER — Other Ambulatory Visit: Payer: Self-pay | Admitting: *Deleted

## 2015-06-24 DIAGNOSIS — B20 Human immunodeficiency virus [HIV] disease: Secondary | ICD-10-CM

## 2015-06-24 MED ORDER — EMTRICITABINE-TENOFOVIR DF 200-300 MG PO TABS: 1.0000 | ORAL_TABLET | Freq: Every day | ORAL | Status: DC

## 2015-06-24 MED ORDER — DARUNAVIR-COBICISTAT 800-150 MG PO TABS: 1.0000 | ORAL_TABLET | Freq: Every day | ORAL | Status: DC

## 2015-07-01 ENCOUNTER — Ambulatory Visit: Payer: Self-pay | Admitting: Internal Medicine

## 2015-08-14 ENCOUNTER — Ambulatory Visit: Payer: Self-pay | Admitting: *Deleted

## 2015-08-14 ENCOUNTER — Ambulatory Visit (INDEPENDENT_AMBULATORY_CARE_PROVIDER_SITE_OTHER): Payer: Self-pay | Admitting: Internal Medicine

## 2015-08-14 ENCOUNTER — Encounter: Payer: Self-pay | Admitting: Internal Medicine

## 2015-08-14 VITALS — BP 113/73 | HR 67 | Temp 98.0°F | Wt 171.0 lb

## 2015-08-14 DIAGNOSIS — F32A Depression, unspecified: Secondary | ICD-10-CM

## 2015-08-14 DIAGNOSIS — Z79899 Other long term (current) drug therapy: Secondary | ICD-10-CM

## 2015-08-14 DIAGNOSIS — K0889 Other specified disorders of teeth and supporting structures: Secondary | ICD-10-CM

## 2015-08-14 DIAGNOSIS — F329 Major depressive disorder, single episode, unspecified: Secondary | ICD-10-CM

## 2015-08-14 DIAGNOSIS — B20 Human immunodeficiency virus [HIV] disease: Secondary | ICD-10-CM

## 2015-08-14 DIAGNOSIS — Z113 Encounter for screening for infections with a predominantly sexual mode of transmission: Secondary | ICD-10-CM

## 2015-08-14 LAB — LIPID PANEL
CHOL/HDL RATIO: 4.7 ratio (ref <=5.0)
CHOLESTEROL: 220 mg/dL — AB (ref 125–200)
HDL: 47 mg/dL (ref >=40)
LDL Cholesterol: 151 mg/dL — ABNORMAL HIGH (ref <130)
TRIGLYCERIDES: 112 mg/dL (ref <150)
VLDL: 22 mg/dL (ref <30)

## 2015-08-14 LAB — CBC WITH DIFFERENTIAL/PLATELET
BASOS ABS: 54 {cells}/uL (ref 0–200)
Basophils Relative: 1 %
EOS PCT: 2 %
Eosinophils Absolute: 108 cells/uL (ref 15–500)
HCT: 43.3 % (ref 38.5–50.0)
Hemoglobin: 14.2 g/dL (ref 13.2–17.1)
Lymphocytes Relative: 35 %
Lymphs Abs: 1890 cells/uL (ref 850–3900)
MCH: 29.8 pg (ref 27.0–33.0)
MCHC: 32.8 g/dL (ref 32.0–36.0)
MCV: 90.8 fL (ref 80.0–100.0)
MONOS PCT: 9 %
MPV: 9.9 fL (ref 7.5–12.5)
Monocytes Absolute: 486 cells/uL (ref 200–950)
NEUTROS ABS: 2862 {cells}/uL (ref 1500–7800)
Neutrophils Relative %: 53 %
PLATELETS: 225 10*3/uL (ref 140–400)
RBC: 4.77 MIL/uL (ref 4.20–5.80)
RDW: 14 % (ref 11.0–15.0)
WBC: 5.4 10*3/uL (ref 3.8–10.8)

## 2015-08-14 LAB — COMPLETE METABOLIC PANEL WITH GFR
ALBUMIN: 4.5 g/dL (ref 3.6–5.1)
ALK PHOS: 56 U/L (ref 40–115)
ALT: 17 U/L (ref 9–46)
AST: 15 U/L (ref 10–40)
BILIRUBIN TOTAL: 0.4 mg/dL (ref 0.2–1.2)
BUN: 8 mg/dL (ref 7–25)
CALCIUM: 9.1 mg/dL (ref 8.6–10.3)
CO2: 27 mmol/L (ref 20–31)
Chloride: 101 mmol/L (ref 98–110)
Creat: 1.11 mg/dL (ref 0.60–1.35)
GFR, EST NON AFRICAN AMERICAN: 85 mL/min (ref >=60)
GLUCOSE: 86 mg/dL (ref 65–99)
POTASSIUM: 4.3 mmol/L (ref 3.5–5.3)
Sodium: 138 mmol/L (ref 135–146)
TOTAL PROTEIN: 7.2 g/dL (ref 6.1–8.1)

## 2015-08-14 NOTE — Assessment & Plan Note (Signed)
Stable.  No SI.

## 2015-08-14 NOTE — Assessment & Plan Note (Signed)
Advised to follow up with the dentist.

## 2015-08-14 NOTE — Assessment & Plan Note (Signed)
Back on medicine and doing well.  Labs today and RTC 4 months unless concerns.

## 2015-08-14 NOTE — BH Specialist Note (Signed)
Counselor met with Herbert Ellison today for a warm handoff from Dr. Linus Salmons.  Patient was oriented times four with good affect and dress.  Patient was accompanied by a spanish interpretur. Patient communicated that things were going "ok".  Patient stated that he had no choice we but to accept all the things in his life that was bad.  Counselor educated patient that sometimes it helps to process what's going on with an objective support person.  Counselor also shared that some things can be helped by making some changes and changing perspective.  Counselor provided support and encouragement for patient.  Counselor recommended that patient make an appointment to come and meet for some counseling services.  Patient agreed and made an appointment.  [name], MA, [facility]

## 2015-08-14 NOTE — Progress Notes (Signed)
  Subjective:    Patient ID: Herbert Ellison, male    DOB: 11/23/1980, 35 y.o.   MRN: [id number]  HPI Comes in for follow up of HIV.   Takes his Prezcobix and Truvada daily but prior to the previous visit he had been out of his medication, letting ADAP expire.  He is back on it but did not return aftr 3 months last time and returns 6 months later.  Continues to be depressed but no SI and not interested in therapy.  Not interested in medication.  Still with pain from work.  Also significant dental pain.  Never followed up with the dentist after being called.  Pain all the time, especially cold.     Review of Systems  Constitutional: Negative for fatigue and unexpected weight change.  HENT: Positive for dental problem. Negative for sore throat and trouble swallowing.   Gastrointestinal: Negative for nausea and diarrhea.  Skin: Negative for rash.  Neurological: Negative for dizziness, light-headedness and headaches.  Psychiatric/Behavioral: Positive for dysphoric mood. Negative for suicidal ideas and sleep disturbance.       Objective:   Physical Exam  Constitutional: He appears well-developed and well-nourished. No distress.  HENT:  Mouth/Throat: Oropharynx is clear and moist. No oropharyngeal exudate.  Poor dentition with broken tooth  Eyes: No scleral icterus.  Cardiovascular: Normal rate, regular rhythm and normal heart sounds.  Exam reveals no friction rub.   No murmur heard. Pulmonary/Chest: Effort normal and breath sounds normal. No respiratory distress. He has no wheezes.  Lymphadenopathy:    He has no cervical adenopathy.  Skin: No rash noted.  Psychiatric: He has a normal mood and affect. His behavior is normal.    Social History   Social History  . Marital Status: Single    Spouse Name: N/A  . Number of Children: N/A  . Years of Education: N/A   Occupational History  . Not on file.   Social History Main Topics  . Smoking status: Current Every Day Smoker --  0.50 packs/day    Types: Cigarettes  . Smokeless tobacco: Never Used     Comment: 5 ciggs daily  . Alcohol Use: No  . Drug Use: 2.00 per week    Special: Marijuana  . Sexual Activity:    Partners: Female     Comment: given condoms   Other Topics Concern  . Not on file   Social History Narrative        Assessment & Plan:

## 2015-08-15 LAB — T-HELPER CELL (CD4) - (RCID CLINIC ONLY)
CD4 % Helper T Cell: 24 % — ABNORMAL LOW (ref 33–55)
CD4 T CELL ABS: 500 /uL (ref 400–2700)

## 2015-08-15 LAB — RPR

## 2015-08-18 LAB — HIV-1 RNA QUANT-NO REFLEX-BLD
HIV 1 RNA Quant: 35 copies/mL — ABNORMAL HIGH (ref <20)
HIV-1 RNA Quant, Log: 1.54 Log copies/mL — ABNORMAL HIGH (ref <1.30)

## 2015-12-16 ENCOUNTER — Ambulatory Visit: Payer: Self-pay | Admitting: Internal Medicine

## 2015-12-20 ENCOUNTER — Other Ambulatory Visit: Payer: Self-pay | Admitting: Internal Medicine

## 2015-12-20 DIAGNOSIS — B20 Human immunodeficiency virus [HIV] disease: Secondary | ICD-10-CM

## 2015-12-22 ENCOUNTER — Telehealth: Payer: Self-pay | Admitting: *Deleted

## 2015-12-22 NOTE — Telephone Encounter (Signed)
Attempted to call patient to remind him he needs to come in for RW/ADAP renewal. Unable to leave a message, noted it on the prescription. Andree CossHowell, Jaasia Viglione M, RN

## 2015-12-25 ENCOUNTER — Ambulatory Visit: Payer: Self-pay | Admitting: Internal Medicine

## 2015-12-29 ENCOUNTER — Ambulatory Visit: Payer: Self-pay

## 2016-01-02 ENCOUNTER — Encounter: Payer: Self-pay | Admitting: Internal Medicine

## 2016-01-20 ENCOUNTER — Encounter: Payer: Self-pay | Admitting: Internal Medicine

## 2016-01-29 ENCOUNTER — Other Ambulatory Visit: Payer: Self-pay | Admitting: *Deleted

## 2016-01-29 DIAGNOSIS — B20 Human immunodeficiency virus [HIV] disease: Secondary | ICD-10-CM

## 2016-01-29 MED ORDER — EMTRICITABINE-TENOFOVIR DF 200-300 MG PO TABS: 1.0000 | ORAL_TABLET | Freq: Every day | ORAL | 5 refills | Status: DC

## 2016-01-29 MED ORDER — DARUNAVIR-COBICISTAT 800-150 MG PO TABS: 1.0000 | ORAL_TABLET | Freq: Every day | ORAL | 5 refills | Status: DC

## 2016-03-09 ENCOUNTER — Telehealth: Payer: Self-pay | Admitting: *Deleted

## 2016-03-09 NOTE — Telephone Encounter (Signed)
Patient called via interpreter stated that he has moved to [place] and will be establishing care there. Advised he needs to sign a release at his new ID clinic and we will then fax the records. He was given our fax #.

## 2016-03-10 ENCOUNTER — Telehealth: Payer: Self-pay | Admitting: *Deleted

## 2016-03-10 NOTE — Telephone Encounter (Signed)
Patient transferring care to [facility].  ROI received, records faxed.  [facility] notified.

## 2018-10-03 ENCOUNTER — Other Ambulatory Visit (HOSPITAL_COMMUNITY)
Admission: RE | Admit: 2018-10-03 | Discharge: 2018-10-03 | Disposition: A | Discharge status: Home / Self Care | Payer: BC Managed Care – PPO | Source: Ambulatory Visit | Attending: Infectious Disease | Admitting: Infectious Disease

## 2018-10-03 ENCOUNTER — Other Ambulatory Visit: Payer: Self-pay

## 2018-10-03 ENCOUNTER — Other Ambulatory Visit: Payer: Self-pay | Admitting: *Deleted

## 2018-10-03 ENCOUNTER — Other Ambulatory Visit: Payer: BC Managed Care – PPO

## 2018-10-03 ENCOUNTER — Other Ambulatory Visit: Payer: Self-pay | Admitting: Pharmacist

## 2018-10-03 ENCOUNTER — Ambulatory Visit: Payer: Self-pay

## 2018-10-03 DIAGNOSIS — B20 Human immunodeficiency virus [HIV] disease: Secondary | ICD-10-CM

## 2018-10-03 DIAGNOSIS — Z113 Encounter for screening for infections with a predominantly sexual mode of transmission: Secondary | ICD-10-CM

## 2018-10-03 MED ORDER — TRIUMEQ 600-50-300 MG PO TABS: 1.0000 | ORAL_TABLET | Freq: Every day | ORAL | 0 refills | Status: DC

## 2018-10-03 NOTE — Progress Notes (Addendum)
Patient returning to care from [place] and is on Triumeq now. Sent in a 30 day supply for him to last until he can be seen at the end of the month. He has [place] and was given a copay card.

## 2018-10-04 LAB — T-HELPER CELL (CD4) - (RCID CLINIC ONLY)
CD4 % Helper T Cell: 22 % — ABNORMAL LOW (ref 33–65)
CD4 T Cell Abs: 376 /uL — ABNORMAL LOW (ref 400–1790)

## 2018-10-04 LAB — URINE CYTOLOGY ANCILLARY ONLY
Chlamydia: NEGATIVE
Neisseria Gonorrhea: NEGATIVE

## 2018-10-05 ENCOUNTER — Telehealth: Payer: Self-pay | Admitting: Pharmacy Technician

## 2018-10-05 NOTE — Telephone Encounter (Signed)
[facility] Patient Advocate Encounter   Was successful in obtaining a ViiV copay card for Triumeq. This copay card will make the patients copay $0.  I have spoken with the patient and he was happy to hear the card would cover his medication. The other copay card he was given was expired.    The billing information is as follows and has been shared with [place]:  RxBin: [id number] PCN: [id number] Member ID: [id number] Group ID: [id number]  Spoke with the patient and he picked up 07/09 @ 09:12am  Herbert Ellison, [place] Patient [facility] for Infectious Disease Phone: [phone] Fax: [phone] 10/05/2018 9:10 AM

## 2018-10-09 ENCOUNTER — Other Ambulatory Visit: Payer: Self-pay

## 2018-10-09 ENCOUNTER — Ambulatory Visit: Payer: Self-pay

## 2018-10-16 LAB — COMPLETE METABOLIC PANEL WITH GFR
AG Ratio: 1.5 (calc) (ref 1.0–2.5)
ALT: 15 U/L (ref 9–46)
AST: 18 U/L (ref 10–40)
Albumin: 4.6 g/dL (ref 3.6–5.1)
Alkaline phosphatase (APISO): 71 U/L (ref 36–130)
BUN: 10 mg/dL (ref 7–25)
CO2: 30 mmol/L (ref 20–32)
Calcium: 9.9 mg/dL (ref 8.6–10.3)
Chloride: 100 mmol/L (ref 98–110)
Creat: 1.24 mg/dL (ref 0.60–1.35)
GFR, Est African American: 84 mL/min/{1.73_m2} (ref >=60)
GFR, Est Non African American: 72 mL/min/{1.73_m2} (ref >=60)
Globulin: 3.1 g/dL (calc) (ref 1.9–3.7)
Glucose, Bld: 77 mg/dL (ref 65–99)
Potassium: 4 mmol/L (ref 3.5–5.3)
Sodium: 137 mmol/L (ref 135–146)
Total Bilirubin: 0.3 mg/dL (ref 0.2–1.2)
Total Protein: 7.7 g/dL (ref 6.1–8.1)

## 2018-10-16 LAB — CBC WITH DIFFERENTIAL/PLATELET
Absolute Monocytes: 543 cells/uL (ref 200–950)
Basophils Absolute: 49 cells/uL (ref 0–200)
Basophils Relative: 0.8 %
Eosinophils Absolute: 122 cells/uL (ref 15–500)
Eosinophils Relative: 2 %
HCT: 39.4 % (ref 38.5–50.0)
Hemoglobin: 13.1 g/dL — ABNORMAL LOW (ref 13.2–17.1)
Lymphs Abs: 1684 cells/uL (ref 850–3900)
MCH: 29.8 pg (ref 27.0–33.0)
MCHC: 33.2 g/dL (ref 32.0–36.0)
MCV: 89.7 fL (ref 80.0–100.0)
MPV: 10.5 fL (ref 7.5–12.5)
Monocytes Relative: 8.9 %
Neutro Abs: 3703 cells/uL (ref 1500–7800)
Neutrophils Relative %: 60.7 %
Platelets: 250 10*3/uL (ref 140–400)
RBC: 4.39 10*6/uL (ref 4.20–5.80)
RDW: 13.2 % (ref 11.0–15.0)
Total Lymphocyte: 27.6 %
WBC: 6.1 10*3/uL (ref 3.8–10.8)

## 2018-10-16 LAB — HIV-1 RNA ULTRAQUANT REFLEX TO GENTYP+
HIV 1 RNA Quant: <20 copies/mL
HIV-1 RNA Quant, Log: <1.3 Log copies/mL

## 2018-10-16 LAB — RPR: RPR Ser Ql: NONREACTIVE

## 2018-10-20 ENCOUNTER — Encounter: Payer: Self-pay | Admitting: Infectious Disease

## 2018-10-23 ENCOUNTER — Telehealth: Payer: Self-pay | Admitting: Infectious Disease

## 2018-10-23 NOTE — Telephone Encounter (Signed)
COVID-19 Pre-Screening Questions:10/23/18 ° °Do you currently have a fever (>100 °F), chills or unexplained body aches? NO ° °Are you currently experiencing new cough, shortness of breath, sore throat, runny nose? NO °•  °Have you recently travelled outside the state of [place] in the last 14 days? NO ° °Have you been in contact with someone that is currently pending confirmation of Covid19 testing or has been confirmed to have the Covid19 virus? NO ° °**If the patient answers NO to ALL questions -  advise the patient to please call the clinic before coming to the office should any symptoms develop.  ° ° ° °

## 2018-10-24 ENCOUNTER — Encounter: Payer: Self-pay | Admitting: Infectious Disease

## 2018-10-24 ENCOUNTER — Ambulatory Visit: Payer: Self-pay | Admitting: Pharmacist

## 2018-10-30 ENCOUNTER — Other Ambulatory Visit: Payer: Self-pay | Admitting: Pharmacist

## 2018-10-30 DIAGNOSIS — B20 Human immunodeficiency virus [HIV] disease: Secondary | ICD-10-CM

## 2018-11-15 ENCOUNTER — Other Ambulatory Visit: Payer: Self-pay

## 2018-11-15 ENCOUNTER — Encounter: Payer: Self-pay | Admitting: Infectious Disease

## 2018-11-15 ENCOUNTER — Ambulatory Visit (INDEPENDENT_AMBULATORY_CARE_PROVIDER_SITE_OTHER): Payer: BC Managed Care – PPO | Admitting: Infectious Disease

## 2018-11-15 ENCOUNTER — Telehealth: Payer: Self-pay | Admitting: Pharmacy Technician

## 2018-11-15 ENCOUNTER — Ambulatory Visit (INDEPENDENT_AMBULATORY_CARE_PROVIDER_SITE_OTHER): Payer: BC Managed Care – PPO | Admitting: Pharmacist

## 2018-11-15 VITALS — BP 116/75 | HR 85 | Temp 98.1°F

## 2018-11-15 DIAGNOSIS — B20 Human immunodeficiency virus [HIV] disease: Secondary | ICD-10-CM

## 2018-11-15 DIAGNOSIS — F17210 Nicotine dependence, cigarettes, uncomplicated: Secondary | ICD-10-CM

## 2018-11-15 DIAGNOSIS — Z23 Encounter for immunization: Secondary | ICD-10-CM | POA: Diagnosis not present

## 2018-11-15 MED ORDER — TRIUMEQ 600-50-300 MG PO TABS: 1.0000 | ORAL_TABLET | Freq: Every day | ORAL | 11 refills | Status: DC

## 2018-11-15 MED FILL — TRIUMEQ 600-50-300 MG TABS: 600-50-300 | 30 days supply | Qty: 30 | Fill #0

## 2018-11-15 NOTE — Addendum Note (Signed)
Addended by: Aundria Rud on: 11/15/2018 10:43 AM   Modules accepted: Orders

## 2018-11-15 NOTE — Telephone Encounter (Signed)
[facility] Patient Advocate Encounter    Findings of the benefits investigation conducted this morning via test claims for the patient's upcoming appointment on 11/15/2018 are as follows:   Insurance: MEDCOPUB active commercial coverage Estimated copay amount: $225.00  [facility] Patient Advocate will follow up once patient arrives for their appointment.  Bartholomew Crews, CPhT Specialty Pharmacy Patient [facility] for Infectious Disease Phone: [phone] Fax: [phone] 11/15/2018 8:47 AM

## 2018-11-15 NOTE — Progress Notes (Signed)
Subjective:  Chief complaint: Concern about medications running out  Patient ID: Herbert Ellison, male    DOB: 11/15/1980, 38 y.o.   MRN: [id number]  HPI  Herbert Ellison is a 38 year old Hispanic man living with HIV that is been well controlled.  He had moved out of state to [place] where he was continued on [place].  He had insurance in [place] that was a [employer] if I understand correctly through the state of [place].  This is still active and he filled 1 bottle of medication though apparently the pharmacy refill that I did not seem to have activated his co-pay card.  I like to switch him over to was a long pharmacy so he can have the meds filled there for now do that until his insurance runs out then engage him with the ViiV assistance program and then make sure that he is on the HIV medication assistance program.  He has been suffering from some mild back pain recently well.    Past Medical History:  Diagnosis Date  . Depression   . Hay fever    when child    No past surgical history on file.  Family History  Problem Relation Age of Onset  . Congestive Heart Failure Mother        Deceased  . Diabetes Mellitus II Maternal Uncle       Social History   Socioeconomic History  . Marital status: Single    Spouse name: Not on file  . Number of children: Not on file  . Years of education: Not on file  . Highest education level: Not on file  Occupational History  . Not on file  Social Needs  . Financial resource strain: Not on file  . Food insecurity    Worry: Not on file    Inability: Not on file  . Transportation needs    Medical: Not on file    Non-medical: Not on file  Tobacco Use  . Smoking status: Current Every Day Smoker    Packs/day: 0.50    Types: Cigarettes  . Smokeless tobacco: Never Used  . Tobacco comment: 5 ciggs daily  Substance and Sexual Activity  . Alcohol use: No    Alcohol/week: 0.0 standard drinks  . Drug use: Yes   Frequency: 2.0 times per week    Types: Marijuana  . Sexual activity: Yes    Partners: Female    Comment: given condoms  Lifestyle  . Physical activity    Days per week: Not on file    Minutes per session: Not on file  . Stress: Not on file  Relationships  . Social Herbalist on phone: Not on file    Gets together: Not on file    Attends religious service: Not on file    Active member of club or organization: Not on file    Attends meetings of clubs or organizations: Not on file    Relationship status: Not on file  Other Topics Concern  . Not on file  Social History Narrative  . Not on file    No Known Allergies   Current Outpatient Medications:  .  abacavir-dolutegravir-lamiVUDine (TRIUMEQ) 600-50-300 MG tablet, Take 1 tablet by mouth daily., Disp: 30 tablet, Rfl: 11   Review of Systems  Constitutional: Negative for activity change, appetite change, chills, diaphoresis, fatigue, fever and unexpected weight change.  HENT: Negative for congestion, rhinorrhea, sinus pressure, sneezing, sore throat and trouble swallowing.   Eyes:  Negative for photophobia and visual disturbance.  Respiratory: Negative for cough, chest tightness, shortness of breath, wheezing and stridor.   Cardiovascular: Negative for chest pain, palpitations and leg swelling.  Gastrointestinal: Negative for abdominal distention, abdominal pain, anal bleeding, blood in stool, constipation, diarrhea, nausea and vomiting.  Genitourinary: Negative for difficulty urinating, dysuria, flank pain and hematuria.  Musculoskeletal: Positive for back pain. Negative for arthralgias, gait problem, joint swelling and myalgias.  Skin: Negative for color change, pallor, rash and wound.  Neurological: Negative for dizziness, tremors, weakness and light-headedness.  Hematological: Negative for adenopathy. Does not bruise/bleed easily.  Psychiatric/Behavioral: Negative for agitation, behavioral problems, confusion,  decreased concentration, dysphoric mood and sleep disturbance.       Objective:   Physical Exam Constitutional:      General: He is not in acute distress.    Appearance: Normal appearance. He is well-developed. He is not ill-appearing or diaphoretic.  HENT:     Head: Normocephalic and atraumatic.     Right Ear: Hearing and external ear normal.     Left Ear: Hearing and external ear normal.     Nose: No nasal deformity or rhinorrhea.  Eyes:     General: No scleral icterus.    Conjunctiva/sclera: Conjunctivae normal.     Right eye: Right conjunctiva is not injected.     Left eye: Left conjunctiva is not injected.     Pupils: Pupils are equal, round, and reactive to light.  Neck:     Musculoskeletal: Normal range of motion and neck supple.     Vascular: No JVD.  Cardiovascular:     Rate and Rhythm: Normal rate and regular rhythm.     Heart sounds: Normal heart sounds, S1 normal and S2 normal. No murmur. No friction rub.  Pulmonary:     Effort: Pulmonary effort is normal. No respiratory distress.     Breath sounds: No wheezing.  Abdominal:     General: Bowel sounds are normal. There is no distension.     Palpations: Abdomen is soft.     Tenderness: There is no abdominal tenderness.  Musculoskeletal: Normal range of motion.     Right shoulder: Normal.     Left shoulder: Normal.     Right hip: Normal.     Left hip: Normal.     Right knee: Normal.     Left knee: Normal.  Lymphadenopathy:     Head:     Right side of head: No submandibular, preauricular or posterior auricular adenopathy.     Left side of head: No submandibular, preauricular or posterior auricular adenopathy.     Cervical: No cervical adenopathy.     Right cervical: No superficial or deep cervical adenopathy.    Left cervical: No superficial or deep cervical adenopathy.  Skin:    General: Skin is warm and dry.     Coloration: Skin is not pale.     Findings: No abrasion, bruising, ecchymosis, erythema, lesion  or rash.     Nails: There is no clubbing.   Neurological:     General: No focal deficit present.     Mental Status: He is alert and oriented to person, place, and time.     Sensory: No sensory deficit.     Coordination: Coordination normal.     Gait: Gait normal.  Psychiatric:        Attention and Perception: He is attentive.        Mood and Affect: Mood normal.  Speech: Speech normal.        Behavior: Behavior normal. Behavior is cooperative.        Thought Content: Thought content normal.        Judgment: Judgment normal.           Assessment & Plan:   HIV disease: Will renew his Triumeq prescription fill it was a long initially, engage in pharmaceutical assistance and then and enroll into HMAP  RTC in 2 months time for labs and visit afterwards  Needs meningococcal vaccinations and also flu vaccine when it is available  Smoking should quit for long and short term reasons.

## 2018-11-17 NOTE — Progress Notes (Signed)
Patient is doing well on Triumeq and will get his medication filled through [facility].

## 2018-12-13 MED FILL — TRIUMEQ 600-50-300 MG TABS: 600-50-300 | 30 days supply | Qty: 30 | Fill #1

## 2019-01-15 NOTE — Telephone Encounter (Signed)
[facility] Patient Advocate Encounter  Spoke with the patient today on the phone and he said his insurance termed on September 30th but he wasn't sure if it would be continued or not. He has an appointment on 10/28 and was going to talk to Dr. Tommy Medal at that appointment about his options. He has 1 tablet remaining and wants to know if we can mail him a bottle by Wednesday. Will call and see if ViiV can provide [place] one time coverage or other options to make that happen. Will follow-up with patient. He voiced understanding and time frame for getting his medication.

## 2019-01-16 ENCOUNTER — Telehealth: Payer: Self-pay | Admitting: Pharmacy Technician

## 2019-01-16 MED FILL — TRIUMEQ 600-50-300 MG TABS: 600-50-300 | 30 days supply | Qty: 30 | Fill #2

## 2019-01-16 NOTE — Telephone Encounter (Signed)
[facility] Patient Advocate Encounter   Patient has been approved for [employer] Advancing Access Patient Assistance Program for Triumeq for 30-days. This assistance will make the patient's copay $0.  I have spoken with the patient and they will have their medication mailed to arrive 01/17/2019.  The billing information is as follows:  Member ID: [id number] [place]: [id number] PCN: [id number] Group: [id number]  Patient knows to call the office with questions or concerns.  Bartholomew Crews, CPhT Specialty Pharmacy Patient [facility] for Infectious Disease Phone: [phone] Fax: [phone] 01/16/2019 1:58 PM

## 2019-01-17 NOTE — Telephone Encounter (Signed)
[facility] Patient Advocate Encounter  Patient called to check the status of his medication. It is set to arrive today and UPS tracking information [phone] has it on the truck set to arrive today by 11:59pm. This information was provided to the patient.

## 2019-01-24 ENCOUNTER — Ambulatory Visit: Payer: BC Managed Care – PPO | Admitting: Infectious Disease

## 2019-02-06 ENCOUNTER — Telehealth: Payer: Self-pay

## 2019-02-06 NOTE — Telephone Encounter (Signed)
COVID-19 Pre-Screening Questions:02/06/19   Do you currently have a fever (>100 F), chills or unexplained body aches? NO  Are you currently experiencing new cough, shortness of breath, sore throat, runny nose? NO .  Have you recently travelled outside the state of [place] in the last 14 days? NO  .  Have you been in contact with someone that is currently pending confirmation of Covid19 testing or has been confirmed to have the Covid19 virus?  NO   **If the patient answers NO to ALL questions -  advise the patient to please call the clinic before coming to the office should any symptoms develop.     

## 2019-02-07 ENCOUNTER — Other Ambulatory Visit: Payer: Self-pay

## 2019-02-07 ENCOUNTER — Encounter: Payer: Self-pay | Admitting: Infectious Disease

## 2019-02-07 ENCOUNTER — Ambulatory Visit (INDEPENDENT_AMBULATORY_CARE_PROVIDER_SITE_OTHER): Payer: BC Managed Care – PPO | Admitting: Infectious Disease

## 2019-02-07 VITALS — BP 122/79 | HR 73 | Temp 98.0°F | Wt 159.0 lb

## 2019-02-07 DIAGNOSIS — Z113 Encounter for screening for infections with a predominantly sexual mode of transmission: Secondary | ICD-10-CM | POA: Diagnosis not present

## 2019-02-07 DIAGNOSIS — F17210 Nicotine dependence, cigarettes, uncomplicated: Secondary | ICD-10-CM

## 2019-02-07 DIAGNOSIS — Z23 Encounter for immunization: Secondary | ICD-10-CM | POA: Diagnosis not present

## 2019-02-07 DIAGNOSIS — B20 Human immunodeficiency virus [HIV] disease: Secondary | ICD-10-CM | POA: Diagnosis not present

## 2019-02-07 DIAGNOSIS — F32A Depression, unspecified: Secondary | ICD-10-CM

## 2019-02-07 DIAGNOSIS — F329 Major depressive disorder, single episode, unspecified: Secondary | ICD-10-CM | POA: Diagnosis not present

## 2019-02-07 NOTE — Progress Notes (Signed)
Chief complaint: Follow-up for HIV V disease on medications Subjective:  Chief complain Patient ID: Herbert Ellison, male    DOB: March 28, 1981, 38 y.o.   MRN: [id number]  HPI  Herbert Ellison is a 38 year old Hispanic man living with HIV that is been well controlled.  He had moved out of state to [place] where he was continued on TRIUMEQ.  He had insurance in [place] that was a [employer] if I understand correctly through the state of [place].  We filled at [facility] but now he seems to be enrolled in HIV medication assistance program.     Past Medical History:  Diagnosis Date  . Depression   . Hay fever    when child  . HIV disease (HCC)     No past surgical history on file.  Family History  Problem Relation Age of Onset  . Congestive Heart Failure Mother        Deceased  . Diabetes Mellitus II Maternal Uncle       Social History   Socioeconomic History  . Marital status: Single    Spouse name: Not on file  . Number of children: Not on file  . Years of education: Not on file  . Highest education level: Not on file  Occupational History  . Not on file  Social Needs  . Financial resource strain: Not on file  . Food insecurity    Worry: Not on file    Inability: Not on file  . Transportation needs    Medical: Not on file    Non-medical: Not on file  Tobacco Use  . Smoking status: Current Every Day Smoker    Packs/day: 0.50    Types: Cigarettes  . Smokeless tobacco: Never Used  . Tobacco comment: 5 ciggs daily  Substance and Sexual Activity  . Alcohol use: No    Alcohol/week: 0.0 standard drinks  . Drug use: Yes    Frequency: 2.0 times per week    Types: Marijuana  . Sexual activity: Yes    Partners: Female    Comment: given condoms  Lifestyle  . Physical activity    Days per week: Not on file    Minutes per session: Not on file  . Stress: Not on file  Relationships  . Social Musician on phone: Not on file    Gets  together: Not on file    Attends religious service: Not on file    Active member of club or organization: Not on file    Attends meetings of clubs or organizations: Not on file    Relationship status: Not on file  Other Topics Concern  . Not on file  Social History Narrative  . Not on file    No Known Allergies   Current Outpatient Medications:  .  abacavir-dolutegravir-lamiVUDine (TRIUMEQ) 600-50-300 MG tablet, Take 1 tablet by mouth daily., Disp: 30 tablet, Rfl: 11   Review of Systems  Constitutional: Negative for activity change, appetite change, chills, diaphoresis, fatigue, fever and unexpected weight change.  HENT: Negative for congestion, rhinorrhea, sinus pressure, sneezing, sore throat and trouble swallowing.   Eyes: Negative for photophobia and visual disturbance.  Respiratory: Negative for cough, chest tightness, shortness of breath, wheezing and stridor.   Cardiovascular: Negative for chest pain, palpitations and leg swelling.  Gastrointestinal: Negative for abdominal distention, abdominal pain, anal bleeding, blood in stool, constipation, diarrhea, nausea and vomiting.  Genitourinary: Negative for difficulty urinating, dysuria, flank pain and hematuria.  Musculoskeletal: Negative for arthralgias, gait problem, joint swelling and myalgias.  Skin: Negative for color change, pallor, rash and wound.  Neurological: Negative for dizziness, tremors, weakness and light-headedness.  Hematological: Negative for adenopathy. Does not bruise/bleed easily.  Psychiatric/Behavioral: Negative for agitation, behavioral problems, confusion, decreased concentration, dysphoric mood and sleep disturbance. The patient is not hyperactive.        Objective:   Physical Exam Constitutional:      General: He is not in acute distress.    Appearance: Normal appearance. He is well-developed. He is not ill-appearing or diaphoretic.  HENT:     Head: Normocephalic and atraumatic.     Right Ear:  Hearing and external ear normal.     Left Ear: Hearing and external ear normal.     Nose: No nasal deformity or rhinorrhea.  Eyes:     General: No scleral icterus.    Conjunctiva/sclera: Conjunctivae normal.     Right eye: Right conjunctiva is not injected.     Left eye: Left conjunctiva is not injected.     Pupils: Pupils are equal, round, and reactive to light.  Neck:     Musculoskeletal: Normal range of motion and neck supple.     Vascular: No JVD.  Cardiovascular:     Rate and Rhythm: Normal rate and regular rhythm.     Heart sounds: Normal heart sounds, S1 normal and S2 normal. No murmur. No friction rub.  Pulmonary:     Effort: Pulmonary effort is normal. No respiratory distress.     Breath sounds: No wheezing.  Abdominal:     General: Bowel sounds are normal. There is no distension.     Palpations: Abdomen is soft.     Tenderness: There is no abdominal tenderness.  Musculoskeletal: Normal range of motion.     Right shoulder: Normal.     Left shoulder: Normal.     Right hip: Normal.     Left hip: Normal.     Right knee: Normal.     Left knee: Normal.  Lymphadenopathy:     Head:     Right side of head: No submandibular, preauricular or posterior auricular adenopathy.     Left side of head: No submandibular, preauricular or posterior auricular adenopathy.     Cervical: No cervical adenopathy.     Right cervical: No superficial or deep cervical adenopathy.    Left cervical: No superficial or deep cervical adenopathy.  Skin:    General: Skin is warm and dry.     Coloration: Skin is not pale.     Findings: No abrasion, bruising, ecchymosis, erythema, lesion or rash.     Nails: There is no clubbing.   Neurological:     General: No focal deficit present.     Mental Status: He is alert and oriented to person, place, and time. Mental status is at baseline.     Sensory: No sensory deficit.     Coordination: Coordination normal.     Gait: Gait normal.  Psychiatric:         Attention and Perception: He is attentive.        Mood and Affect: Mood normal.        Speech: Speech normal.        Behavior: Behavior normal. Behavior is cooperative.        Thought Content: Thought content normal.        Judgment: Judgment normal.           Assessment & Plan:  HIV disease: Renew Triumeq check labs today and have him come back in January for renewal of his HIV medication assistance program.  I would like for him to build to purchase [place] care plan since he is a citizen.   Flu shot and meningitis shot today  Smoking he is working on smoking cessation

## 2019-02-09 MED FILL — TRIUMEQ 600-50-300 MG TABS: 600-50-300 | 30 days supply | Qty: 30 | Fill #3

## 2019-02-15 LAB — HIV-1 RNA QUANT-NO REFLEX-BLD
HIV 1 RNA Quant: <20 copies/mL
HIV-1 RNA Quant, Log: <1.3 Log copies/mL

## 2019-03-08 NOTE — Telephone Encounter (Addendum)
[facility] Patient Advocate Encounter  Sent a notification to V[employer]V via fax to let them know that the patient is UMAP approved until 06/27/2019 and will not need further coverage at this time.   His [employer] insurance termed in September 2020 but remains active with status premium not paid. Will need it to be canceled completely before the state can process the request. He is coming in this week to get assistance on resolving this and bridging with medication. He currently has 7 tablets.  Bartholomew Crews, CPhT Specialty Pharmacy Patient [facility] for Infectious Disease Phone: [phone] Fax: [phone] 03/08/2019 10:18 AM

## 2019-03-26 ENCOUNTER — Other Ambulatory Visit: Payer: Self-pay

## 2019-03-26 DIAGNOSIS — B20 Human immunodeficiency virus [HIV] disease: Secondary | ICD-10-CM

## 2019-03-26 DIAGNOSIS — F32A Depression, unspecified: Secondary | ICD-10-CM

## 2019-03-26 DIAGNOSIS — F329 Major depressive disorder, single episode, unspecified: Secondary | ICD-10-CM

## 2019-03-26 DIAGNOSIS — Z113 Encounter for screening for infections with a predominantly sexual mode of transmission: Secondary | ICD-10-CM

## 2019-03-26 DIAGNOSIS — F17210 Nicotine dependence, cigarettes, uncomplicated: Secondary | ICD-10-CM

## 2019-03-27 LAB — T-HELPER CELL (CD4) - (RCID CLINIC ONLY)
CD4 % Helper T Cell: 18 % — ABNORMAL LOW (ref 33–65)
CD4 T Cell Abs: 263 /uL — ABNORMAL LOW (ref 400–1790)

## 2019-04-03 LAB — CBC WITH DIFFERENTIAL/PLATELET
Absolute Monocytes: 535 cells/uL (ref 200–950)
Basophils Absolute: 32 cells/uL (ref 0–200)
Basophils Relative: 0.6 %
Eosinophils Absolute: 81 cells/uL (ref 15–500)
Eosinophils Relative: 1.5 %
HCT: 41.7 % (ref 38.5–50.0)
Hemoglobin: 14.2 g/dL (ref 13.2–17.1)
Lymphs Abs: 1528 cells/uL (ref 850–3900)
MCH: 30.6 pg (ref 27.0–33.0)
MCHC: 34.1 g/dL (ref 32.0–36.0)
MCV: 89.9 fL (ref 80.0–100.0)
MPV: 10.6 fL (ref 7.5–12.5)
Monocytes Relative: 9.9 %
Neutro Abs: 3224 cells/uL (ref 1500–7800)
Neutrophils Relative %: 59.7 %
Platelets: 250 10*3/uL (ref 140–400)
RBC: 4.64 10*6/uL (ref 4.20–5.80)
RDW: 13 % (ref 11.0–15.0)
Total Lymphocyte: 28.3 %
WBC: 5.4 10*3/uL (ref 3.8–10.8)

## 2019-04-03 LAB — COMPLETE METABOLIC PANEL WITH GFR
AG Ratio: 1.7 (calc) (ref 1.0–2.5)
ALT: 12 U/L (ref 9–46)
AST: 13 U/L (ref 10–40)
Albumin: 4.5 g/dL (ref 3.6–5.1)
Alkaline phosphatase (APISO): 58 U/L (ref 36–130)
BUN: 9 mg/dL (ref 7–25)
CO2: 28 mmol/L (ref 20–32)
Calcium: 9.6 mg/dL (ref 8.6–10.3)
Chloride: 106 mmol/L (ref 98–110)
Creat: 1.09 mg/dL (ref 0.60–1.35)
GFR, Est African American: 98 mL/min/{1.73_m2} (ref >=60)
GFR, Est Non African American: 84 mL/min/{1.73_m2} (ref >=60)
Globulin: 2.6 g/dL (calc) (ref 1.9–3.7)
Glucose, Bld: 101 mg/dL — ABNORMAL HIGH (ref 65–99)
Potassium: 4.3 mmol/L (ref 3.5–5.3)
Sodium: 141 mmol/L (ref 135–146)
Total Bilirubin: 0.5 mg/dL (ref 0.2–1.2)
Total Protein: 7.1 g/dL (ref 6.1–8.1)

## 2019-04-03 LAB — HIV-1 RNA QUANT-NO REFLEX-BLD
HIV 1 RNA Quant: <20 copies/mL
HIV-1 RNA Quant, Log: <1.3 Log copies/mL

## 2019-04-03 LAB — PHOSPHORUS: Phosphorus: 2.7 mg/dL (ref 2.5–4.5)

## 2019-04-03 LAB — RPR: RPR Ser Ql: NONREACTIVE

## 2019-04-09 ENCOUNTER — Encounter: Payer: BC Managed Care – PPO | Admitting: Infectious Disease

## 2019-04-20 ENCOUNTER — Telehealth: Payer: Self-pay

## 2019-04-20 NOTE — Telephone Encounter (Signed)
COVID-19 Pre-Screening Questions:04/20/19  Do you currently have a fever (>100 F), chills or unexplained body aches?NO   Are you currently experiencing new cough, shortness of breath, sore throat, runny nose? NO .  Have you recently travelled outside the state of [place] in the last 14 days?NO  .  Have you been in contact with someone that is currently pending confirmation of Covid19 testing or has been confirmed to have the Covid19 virus?  NO  **If the patient answers NO to ALL questions -  advise the patient to please call the clinic before coming to the office should any symptoms develop.     

## 2019-04-23 ENCOUNTER — Other Ambulatory Visit: Payer: Self-pay

## 2019-04-23 ENCOUNTER — Ambulatory Visit: Payer: BC Managed Care – PPO | Admitting: Infectious Disease

## 2019-04-23 ENCOUNTER — Telehealth: Payer: Self-pay | Admitting: Pharmacy Technician

## 2019-04-23 MED FILL — TRIUMEQ 600-50-300 MG TABS: 600-50-300 | 30 days supply | Qty: 30 | Fill #4

## 2019-04-23 NOTE — Telephone Encounter (Addendum)
[facility] Patient Advocate Encounter   Was successful in obtaining [employer] patient assistance for Triumeq.    The billing information is as follows and has been shared with [facility].  RxBin: [id number] PCN: [id number] Member ID: [id number] Group ID: [id number]  This is the final emergency fill with Viiv, 3rd and final.  Going forward he will need full application to receive assistance.  This fill will bridge him until [facility] approved.  Netty Starring. Dimas Aguas CPhT Specialty Pharmacy Patient [facility] for Infectious Disease Phone: [phone] Fax:  [phone]

## 2019-04-24 ENCOUNTER — Encounter: Payer: Self-pay | Admitting: Infectious Disease

## 2019-05-22 ENCOUNTER — Other Ambulatory Visit: Payer: Self-pay | Admitting: Pharmacist

## 2019-05-22 DIAGNOSIS — B20 Human immunodeficiency virus [HIV] disease: Secondary | ICD-10-CM

## 2019-05-22 MED ORDER — TRIUMEQ 600-50-300 MG PO TABS: 1.0000 | ORAL_TABLET | Freq: Every day | ORAL | 5 refills | Status: DC

## 2019-05-22 NOTE — Progress Notes (Signed)
Patient is ADAP approved. Sending Triumeq to [employer] on [place]. Kathie Rhodes will call patient and coordinate.

## 2019-05-23 ENCOUNTER — Ambulatory Visit: Payer: BC Managed Care – PPO | Admitting: Infectious Disease

## 2019-06-25 ENCOUNTER — Ambulatory Visit (INDEPENDENT_AMBULATORY_CARE_PROVIDER_SITE_OTHER): Payer: Self-pay | Admitting: Infectious Disease

## 2019-06-25 ENCOUNTER — Encounter: Payer: Self-pay | Admitting: Infectious Disease

## 2019-06-25 ENCOUNTER — Other Ambulatory Visit: Payer: Self-pay

## 2019-06-25 VITALS — BP 125/78 | HR 84 | Temp 98.3°F | Wt 165.0 lb

## 2019-06-25 DIAGNOSIS — Z113 Encounter for screening for infections with a predominantly sexual mode of transmission: Secondary | ICD-10-CM

## 2019-06-25 DIAGNOSIS — K029 Dental caries, unspecified: Secondary | ICD-10-CM

## 2019-06-25 DIAGNOSIS — Z79899 Other long term (current) drug therapy: Secondary | ICD-10-CM

## 2019-06-25 DIAGNOSIS — B20 Human immunodeficiency virus [HIV] disease: Secondary | ICD-10-CM

## 2019-06-25 DIAGNOSIS — F17210 Nicotine dependence, cigarettes, uncomplicated: Secondary | ICD-10-CM

## 2019-06-25 HISTORY — DX: Dental caries, unspecified: K02.9

## 2019-06-25 MED ORDER — VARENICLINE TARTRATE 1 MG PO TABS: 1.0000 mg | ORAL_TABLET | Freq: Two times a day (BID) | ORAL | 1 refills | Status: DC

## 2019-06-25 MED ORDER — TRIUMEQ 600-50-300 MG PO TABS: 1.0000 | ORAL_TABLET | Freq: Every day | ORAL | 11 refills | Status: DC

## 2019-06-25 MED ORDER — CHANTIX STARTING MONTH PAK 0.5 MG X 11 & 1 MG X 42 PO TABS: ORAL_TABLET | ORAL | 0 refills | Status: DC

## 2019-06-25 NOTE — Patient Instructions (Signed)
COVID-19 Vaccine Information can be found at: [email or web address] For questions related to vaccine distribution or appointments, please email [email or web address] or call [phone]    

## 2019-06-25 NOTE — Progress Notes (Signed)
Chief complaint: Follow-up for HIV V disease on medications complaint of painful cavities Subjective:  Chief complain Patient ID: Herbert Ellison, male    DOB: [birth year], 39 y.o.   MRN: [id number]  HPI  Herbert Ellison is a 39 year old Hispanic man living with HIV that is been well controlled.  He had moved out of state to [place] where he was continued on [place].  He had insurance in [place] that was a [employer] if I understand correctly through the state of [place].  He is now enrolled in [place] again and renewed  His viral load was suppressed when last checked his TRIUMEQ is supposed to arrive next few days as he is now out of it.  He does continue to smoke cigarettes.  He is having some dental pain as well and would like to be seen by dental clinic.     Past Medical History:  Diagnosis Date  . Depression   . Hay fever    when child  . HIV disease ([facility])     No past surgical history on file.  Family History  Problem Relation Age of Onset  . Congestive Heart Failure Mother        Deceased  . Diabetes Mellitus II Maternal Uncle       Social History   Socioeconomic History  . Marital status: Single    Spouse name: Not on file  . Number of children: Not on file  . Years of education: Not on file  . Highest education level: Not on file  Occupational History  . Not on file  Tobacco Use  . Smoking status: Current Every Day Smoker    Packs/day: 0.50    Types: Cigarettes  . Smokeless tobacco: Never Used  . Tobacco comment: 5 ciggs daily  Substance and Sexual Activity  . Alcohol use: No    Alcohol/week: 0.0 standard drinks  . Drug use: Yes    Frequency: 2.0 times per week    Types: Marijuana  . Sexual activity: Yes    Partners: Female    Comment: given condoms  Other Topics Concern  . Not on file  Social History Narrative  . Not on file   Social Determinants of Health   Financial Resource Strain:   . Difficulty of Paying Living Expenses:     Food Insecurity:   . Worried About Charity fundraiser in the Last Year:   . Arboriculturist in the Last Year:   Transportation Needs:   . Film/video editor (Medical):   Marland Kitchen Lack of Transportation (Non-Medical):   Physical Activity:   . Days of Exercise per Week:   . Minutes of Exercise per Session:   Stress:   . Feeling of Stress :   Social Connections:   . Frequency of Communication with Friends and Family:   . Frequency of Social Gatherings with Friends and Family:   . Attends Religious Services:   . Active Member of Clubs or Organizations:   . Attends Archivist Meetings:   Marland Kitchen Marital Status:     No Known Allergies   Current Outpatient Medications:  .  abacavir-dolutegravir-lamiVUDine (TRIUMEQ) 600-50-300 MG tablet, Take 1 tablet by mouth daily., Disp: 30 tablet, Rfl: 5   Review of Systems  Constitutional: Negative for activity change, appetite change, chills, diaphoresis, fatigue, fever and unexpected weight change.  HENT: Positive for dental problem. Negative for congestion, rhinorrhea, sinus pressure, sneezing, sore throat and trouble swallowing.   Eyes: Negative  for photophobia and visual disturbance.  Respiratory: Negative for cough, chest tightness, shortness of breath, wheezing and stridor.   Cardiovascular: Negative for chest pain, palpitations and leg swelling.  Gastrointestinal: Negative for abdominal distention, abdominal pain, anal bleeding, blood in stool, constipation, diarrhea, nausea and vomiting.  Genitourinary: Negative for difficulty urinating, dysuria, flank pain and hematuria.  Musculoskeletal: Negative for arthralgias, gait problem, joint swelling and myalgias.  Skin: Negative for color change, pallor, rash and wound.  Neurological: Negative for dizziness, tremors, weakness and light-headedness.  Hematological: Negative for adenopathy. Does not bruise/bleed easily.  Psychiatric/Behavioral: Negative for agitation, behavioral problems,  confusion, decreased concentration, dysphoric mood and sleep disturbance. The patient is not hyperactive.        Objective:   Physical Exam Constitutional:      General: He is not in acute distress.    Appearance: Normal appearance. He is well-developed. He is not ill-appearing or diaphoretic.  HENT:     Head: Normocephalic and atraumatic.     Right Ear: Hearing and external ear normal.     Left Ear: Hearing and external ear normal.     Nose: No nasal deformity or rhinorrhea.  Eyes:     General: No scleral icterus.    Conjunctiva/sclera: Conjunctivae normal.     Right eye: Right conjunctiva is not injected.     Left eye: Left conjunctiva is not injected.  Neck:     Vascular: No JVD.  Cardiovascular:     Rate and Rhythm: Normal rate and regular rhythm.     Heart sounds: Normal heart sounds, S1 normal and S2 normal. No murmur. No friction rub.  Pulmonary:     Effort: Pulmonary effort is normal. No respiratory distress.     Breath sounds: No wheezing.  Abdominal:     General: Bowel sounds are normal. There is no distension.     Palpations: Abdomen is soft.     Tenderness: There is no abdominal tenderness.  Musculoskeletal:        General: Normal range of motion.     Right shoulder: Normal.     Left shoulder: Normal.     Cervical back: Normal range of motion and neck supple.     Right hip: Normal.     Left hip: Normal.     Right knee: Normal.     Left knee: Normal.  Lymphadenopathy:     Head:     Right side of head: No submandibular, preauricular or posterior auricular adenopathy.     Left side of head: No submandibular, preauricular or posterior auricular adenopathy.     Cervical: No cervical adenopathy.     Right cervical: No superficial or deep cervical adenopathy.    Left cervical: No superficial or deep cervical adenopathy.  Skin:    General: Skin is warm and dry.     Coloration: Skin is not pale.     Findings: No abrasion, bruising, ecchymosis, erythema, lesion or  rash.     Nails: There is no clubbing.  Neurological:     General: No focal deficit present.     Mental Status: He is alert and oriented to person, place, and time. Mental status is at baseline.     Sensory: No sensory deficit.     Coordination: Coordination normal.     Gait: Gait normal.  Psychiatric:        Attention and Perception: He is attentive.        Mood and Affect: Mood normal.  Speech: Speech normal.        Behavior: Behavior normal. Behavior is cooperative.        Thought Content: Thought content normal.        Judgment: Judgment normal.           Assessment & Plan:   HIV disease: Renew Triumeq return to clinic in the summertime to renew HMAP again    Smoking he is working on smoking cessation Sending in [place] to [facility] on Herbert Ellison  I also will likely get him off of the abacavir and switch him to Triumeq in the future.  Need for Covid vaccination I have strongly recommended that he get this a very skeptical of it.  Dental caries referred to dental clinic

## 2019-08-22 ENCOUNTER — Encounter: Payer: Self-pay | Admitting: Infectious Disease

## 2019-10-03 ENCOUNTER — Telehealth: Payer: Self-pay

## 2019-10-03 MED ORDER — AMOXICILLIN-POT CLAVULANATE 875-125 MG PO TABS: 1.0000 | ORAL_TABLET | Freq: Two times a day (BID) | ORAL | 0 refills | Status: AC

## 2019-10-03 NOTE — Telephone Encounter (Signed)
Does he need any antibiotics?

## 2019-10-03 NOTE — Addendum Note (Signed)
Addended by: Valarie Cones on: 10/03/2019 03:34 PM   Modules accepted: Orders

## 2019-10-03 NOTE — Telephone Encounter (Signed)
Verbal order received for antibiotic. Patient made aware.  Herbert Ellison

## 2019-10-03 NOTE — Telephone Encounter (Signed)
Patient requesting phone number for dental clinic. LPN was able to connect patient with Claris Che and scheduled for dental clinic next week. Advise patient to take tylenol/ ibuprofen as needed for pain. Routing to MD to also make aware. Valarie Cones

## 2019-10-22 ENCOUNTER — Other Ambulatory Visit: Payer: Self-pay

## 2019-11-05 ENCOUNTER — Encounter: Payer: Self-pay | Admitting: Infectious Disease

## 2019-11-28 ENCOUNTER — Other Ambulatory Visit: Payer: Self-pay

## 2019-12-04 ENCOUNTER — Other Ambulatory Visit: Payer: Self-pay

## 2019-12-13 ENCOUNTER — Encounter: Payer: Self-pay | Admitting: Infectious Disease

## 2019-12-17 ENCOUNTER — Other Ambulatory Visit: Payer: Self-pay

## 2019-12-17 ENCOUNTER — Telehealth (INDEPENDENT_AMBULATORY_CARE_PROVIDER_SITE_OTHER): Payer: Self-pay | Admitting: Infectious Disease

## 2019-12-17 ENCOUNTER — Ambulatory Visit: Payer: Self-pay

## 2019-12-17 DIAGNOSIS — B20 Human immunodeficiency virus [HIV] disease: Secondary | ICD-10-CM

## 2019-12-17 DIAGNOSIS — K029 Dental caries, unspecified: Secondary | ICD-10-CM

## 2019-12-17 DIAGNOSIS — F17210 Nicotine dependence, cigarettes, uncomplicated: Secondary | ICD-10-CM

## 2019-12-17 NOTE — Progress Notes (Signed)
Virtual Visit via Telephone Note  I connected with Herbert Ellison on 12/17/19 at  2:30 PM EDT by telephone and verified that I am speaking with the correct person using two identifiers.  Location: Patient: Home Provider: [facility]   I discussed the limitations, risks, security and privacy concerns of performing an evaluation and management service by telephone and the availability of in person appointments. I also discussed with the patient that there may be a patient responsible charge related to this service. The patient expressed understanding and agreed to proceed.   History of Present Illness:  Hispanic man living with HIV that is been well controlled.  He had moved out of state to [place] where he was continued on TRIUMEQ.   He has an appointment today over the phone with the financial counselor to hopefully renew his HIV medication assistance program I am concerned however that he will run out of medication soon at his as it is already in September 20.  He has not had recent labs such schedule him for labs in the 29th.  Also asked him when he comes in to let us know if he is running out of medications and perhaps we can find samples of Biktarvy for example to bridge him.  Ultimately also might be better to switch him over from Triumeq to [place].   He has not gotten vaccinated for COVID-19 which I recommended when he comes to clinic he can ask about the Pfizer vaccine   Past Medical History:  Diagnosis Date  . Dental caries 06/25/2019  . Depression   . Hay fever    when child  . HIV disease ([facility])     No past surgical history on file.  Family History  Problem Relation Age of Onset  . Congestive Heart Failure Mother        Deceased  . Diabetes Mellitus II Maternal Uncle       Social History   Socioeconomic History  . Marital status: Single    Spouse name: Not on file  . Number of children: Not on file  . Years of education: Not on file  . Highest  education level: Not on file  Occupational History  . Not on file  Tobacco Use  . Smoking status: Current Every Day Smoker    Packs/day: 0.50    Types: Cigarettes  . Smokeless tobacco: Never Used  . Tobacco comment: 5 ciggs daily  Substance and Sexual Activity  . Alcohol use: No    Alcohol/week: 0.0 standard drinks  . Drug use: Yes    Frequency: 2.0 times per week    Types: Marijuana  . Sexual activity: Yes    Partners: Female    Comment: given condoms  Other Topics Concern  . Not on file  Social History Narrative  . Not on file   Social Determinants of Health   Financial Resource Strain:   . Difficulty of Paying Living Expenses: Not on file  Food Insecurity:   . Worried About Programme researcher, broadcasting/film/video in the Last Year: Not on file  . Ran Out of Food in the Last Year: Not on file  Transportation Needs:   . Lack of Transportation (Medical): Not on file  . Lack of Transportation (Non-Medical): Not on file  Physical Activity:   . Days of Exercise per Week: Not on file  . Minutes of Exercise per Session: Not on file  Stress:   . Feeling of Stress : Not on file  Social Connections:   .  Frequency of Communication with Friends and Family: Not on file  . Frequency of Social Gatherings with Friends and Family: Not on file  . Attends Religious Services: Not on file  . Active Member of Clubs or Organizations: Not on file  . Attends Banker Meetings: Not on file  . Marital Status: Not on file    No Known Allergies   Current Outpatient Medications:  .  abacavir-dolutegravir-lamiVUDine (TRIUMEQ) 600-50-300 MG tablet, Take 1 tablet by mouth daily., Disp: 30 tablet, Rfl: 11 .  varenicline (CHANTIX CONTINUING MONTH PAK) 1 MG tablet, Take 1 tablet (1 mg total) by mouth 2 (two) times daily., Disp: 60 tablet, Rfl: 1 .  varenicline (CHANTIX STARTING MONTH PAK) 0.5 MG X 11 & 1 MG X 42 tablet, 0.5mg  qdaily x 3d then 0.5 mg BID x  4d then 1 mg BID, Disp: 53 tablet, Rfl: 0     Observations/Objective:  He appeared to be doing relatively well over the phone though concerned about the fact that he has not yet renewed his HIV medication assistance program  Assessment and Plan:  HIV disease renew HMA P: Continue TRIUMEQ but if he is running out we will need to give him samples to bridge him.  We are checking labs as mentioned next week.  Smoking would like him to stop smoking and will see how he is progressing with this.  COVID prevention he should get his COVID-19 vaccine when he comes for his labs next week  Follow Up Instructions:    I discussed the assessment and treatment plan with the patient. The patient was provided an opportunity to ask questions and all were answered. The patient agreed with the plan and demonstrated an understanding of the instructions.   The patient was advised to call back or seek an in-person evaluation if the symptoms worsen or if the condition fails to improve as anticipated.  Note I conducted this telephone interview using a [place] interpreter telephonic system  I provided 21 minutes of non-face-to-face time during this encounter.   Acey Lav, MD

## 2019-12-18 ENCOUNTER — Encounter: Payer: Self-pay | Admitting: Infectious Disease

## 2019-12-26 ENCOUNTER — Other Ambulatory Visit: Payer: Self-pay

## 2019-12-29 ENCOUNTER — Other Ambulatory Visit: Payer: Self-pay | Admitting: Infectious Disease

## 2019-12-29 DIAGNOSIS — B20 Human immunodeficiency virus [HIV] disease: Secondary | ICD-10-CM

## 2020-01-11 ENCOUNTER — Other Ambulatory Visit: Payer: Self-pay

## 2020-04-07 ENCOUNTER — Telehealth: Payer: Self-pay | Admitting: *Deleted

## 2020-04-07 NOTE — Telephone Encounter (Signed)
Patient called, needs to change appointment as he is working tomorrow and also does not have transportation.  RN rescheduled patient for 1/18 (labs, financial) and told him that we can help with transportation if he needs a ride. He said he would let us know. He also asked for a covid shot when he comes in. He states he has not yet had one. RN tried to encourage him to get one asap from anywhere, he asked for it to be here when he comes in next week. RN scheduled nurse visit.  Andree Coss, RN

## 2020-04-08 ENCOUNTER — Ambulatory Visit: Payer: Self-pay

## 2020-04-08 ENCOUNTER — Other Ambulatory Visit: Payer: Self-pay

## 2020-04-15 ENCOUNTER — Other Ambulatory Visit: Payer: Self-pay

## 2020-04-15 ENCOUNTER — Ambulatory Visit (INDEPENDENT_AMBULATORY_CARE_PROVIDER_SITE_OTHER): Payer: Self-pay

## 2020-04-15 ENCOUNTER — Ambulatory Visit: Payer: Self-pay

## 2020-04-15 DIAGNOSIS — B20 Human immunodeficiency virus [HIV] disease: Secondary | ICD-10-CM

## 2020-04-15 DIAGNOSIS — Z23 Encounter for immunization: Secondary | ICD-10-CM

## 2020-04-15 NOTE — Progress Notes (Signed)
   Covid-19 Vaccination Clinic  Name:  Josian Lanese    MRN: [id number] DOB: 1981/01/03  04/15/2020  Mr. Cubero-Aldarondo was observed post Covid-19 immunization for 15 minutes without incident. He was provided with Vaccine Information Sheet and instruction to access the V-Safe system.   Mr. Wynell Balloon was instructed to call 911 with any severe reactions post vaccine: Marland Kitchen Difficulty breathing  . Swelling of face and throat  . A fast heartbeat  . A bad rash all over body  . Dizziness and weakness       Rosanna Randy, RN

## 2020-04-16 LAB — T-HELPER CELL (CD4) - (RCID CLINIC ONLY)
CD4 % Helper T Cell: 21 % — ABNORMAL LOW (ref 33–65)
CD4 T Cell Abs: 323 /uL — ABNORMAL LOW (ref 400–1790)

## 2020-04-17 LAB — COMPLETE METABOLIC PANEL WITH GFR
AG Ratio: 1.6 (calc) (ref 1.0–2.5)
ALT: 13 U/L (ref 9–46)
AST: 11 U/L (ref 10–40)
Albumin: 4.7 g/dL (ref 3.6–5.1)
Alkaline phosphatase (APISO): 60 U/L (ref 36–130)
BUN: 11 mg/dL (ref 7–25)
CO2: 29 mmol/L (ref 20–32)
Calcium: 9.9 mg/dL (ref 8.6–10.3)
Chloride: 100 mmol/L (ref 98–110)
Creat: 1 mg/dL (ref 0.60–1.35)
GFR, Est African American: 108 mL/min/{1.73_m2} (ref >=60)
GFR, Est Non African American: 93 mL/min/{1.73_m2} (ref >=60)
Globulin: 2.9 g/dL (calc) (ref 1.9–3.7)
Glucose, Bld: 100 mg/dL — ABNORMAL HIGH (ref 65–99)
Potassium: 3.9 mmol/L (ref 3.5–5.3)
Sodium: 136 mmol/L (ref 135–146)
Total Bilirubin: 0.2 mg/dL (ref 0.2–1.2)
Total Protein: 7.6 g/dL (ref 6.1–8.1)

## 2020-04-17 LAB — CBC WITH DIFFERENTIAL/PLATELET
Absolute Monocytes: 587 cells/uL (ref 200–950)
Basophils Absolute: 28 cells/uL (ref 0–200)
Basophils Relative: 0.4 %
Eosinophils Absolute: 90 cells/uL (ref 15–500)
Eosinophils Relative: 1.3 %
HCT: 42.7 % (ref 38.5–50.0)
Hemoglobin: 14.4 g/dL (ref 13.2–17.1)
Lymphs Abs: 1594 cells/uL (ref 850–3900)
MCH: 30.6 pg (ref 27.0–33.0)
MCHC: 33.7 g/dL (ref 32.0–36.0)
MCV: 90.7 fL (ref 80.0–100.0)
MPV: 10.4 fL (ref 7.5–12.5)
Monocytes Relative: 8.5 %
Neutro Abs: 4602 cells/uL (ref 1500–7800)
Neutrophils Relative %: 66.7 %
Platelets: 275 10*3/uL (ref 140–400)
RBC: 4.71 10*6/uL (ref 4.20–5.80)
RDW: 13.1 % (ref 11.0–15.0)
Total Lymphocyte: 23.1 %
WBC: 6.9 10*3/uL (ref 3.8–10.8)

## 2020-04-17 LAB — HIV-1 RNA QUANT-NO REFLEX-BLD
HIV 1 RNA Quant: <20 Copies/mL
HIV-1 RNA Quant, Log: <1.3 Log cps/mL

## 2020-04-17 LAB — RPR: RPR Ser Ql: NONREACTIVE

## 2020-04-23 ENCOUNTER — Encounter: Payer: Self-pay | Admitting: Infectious Disease

## 2020-05-06 ENCOUNTER — Ambulatory Visit: Payer: Self-pay | Admitting: Infectious Disease

## 2020-06-09 ENCOUNTER — Telehealth: Payer: Self-pay

## 2020-06-09 DIAGNOSIS — B20 Human immunodeficiency virus [HIV] disease: Secondary | ICD-10-CM

## 2020-06-09 MED ORDER — TRIUMEQ 600-50-300 MG PO TABS: 1.0000 | ORAL_TABLET | Freq: Every day | ORAL | 0 refills | Status: DC

## 2020-06-09 NOTE — Telephone Encounter (Signed)
Received call from patient requesting Triumeq refill. Patient has renewed ADAP coverage and scheduled to see Dr. Daiva Eves on 06/25/20. Patient states he is still using [facility] on [address]. RN will send refill.   Sandie Ano, RN

## 2020-06-11 ENCOUNTER — Telehealth: Payer: Self-pay

## 2020-06-11 NOTE — Telephone Encounter (Signed)
Received call from patient, he states he has been unable to get his medication from [facility]. RN called [facility] and they state patient's Triumeq is ready to pick up at no cost. RN called patient back and relayed this information to him. Provided patient with pharmacy's phone number so that he can set up home delivery.   Sandie Ano, RN

## 2020-06-25 ENCOUNTER — Ambulatory Visit: Payer: Self-pay | Admitting: Infectious Disease

## 2020-07-02 ENCOUNTER — Telehealth: Payer: Self-pay

## 2020-07-02 NOTE — Telephone Encounter (Signed)
   Derrien Cubero-Aldarondo DOB: 1981/02/11 MRN: [id number]   RIDER WAIVER AND RELEASE OF LIABILITY  For purposes of improving physical access to our facilities, [facility] is pleased to partner with third parties to provide [facility] patients or other authorized individuals the option of convenient, on-demand ground transportation services (the [employer]") through use of the technology service that enables users to request on-demand ground transportation from independent third-party providers.  By opting to use and accept these [employer], I, the undersigned, hereby agree on behalf of myself, and on behalf of any minor child using the [employer] for whom I am the parent or legal guardian, as follows:  1. Science writer provided to me are provided by independent third-party transportation providers who are not [employer] or employees and who are unaffiliated with [employer]. 2. [facility] is neither a transportation carrier nor a common or public carrier. 3. [facility] has no control over the quality or safety of the transportation that occurs as a result of the [employer]. 4. [facility] cannot guarantee that any third-party transportation provider will complete any arranged transportation service. 5. [facility] makes no representation, warranty, or guarantee regarding the reliability, timeliness, quality, safety, suitability, or availability of any of the Transport Services or that they will be error free. 6. I fully understand that traveling by vehicle involves risks and dangers of serious bodily injury, including permanent disability, paralysis, and death. I agree, on behalf of myself and on behalf of any minor child using the Transport Services for whom I am the parent or legal guardian, that the entire risk arising out of my use of the [employer] remains solely with me, to the maximum extent permitted under applicable law. 7. The  [employer] are provided "as is" and "as available." [facility] disclaims all representations and warranties, express, implied or statutory, not expressly set out in these terms, including the implied warranties of merchantability and fitness for a particular purpose. 8. I hereby waive and release [facility], its agents, employees, officers, directors, representatives, insurers, attorneys, assigns, successors, subsidiaries, and affiliates from any and all past, present, or future claims, demands, liabilities, actions, causes of action, or suits of any kind directly or indirectly arising from acceptance and use of the [employer]. 9. I further waive and release [facility] and its affiliates from all present and future liability and responsibility for any injury or death to persons or damages to property caused by or related to the use of the [employer]. 10. I have read this Waiver and Release of Liability, and I understand the terms used in it and their legal significance. This Waiver is freely and voluntarily given with the understanding that my right (as well as the right of any minor child for whom I am the parent or legal guardian using the [employer]) to legal recourse against  in connection with the [employer] is knowingly surrendered in return for use of these services.   I attest that I read the consent document to [facility], gave Mr. Cubero-Aldarondo the opportunity to ask questions and answered the questions asked (if any). I affirm that Raheem Cubero-Aldarondo then provided consent for he's participation in this program.      Launa Grill, read to patient in [place]

## 2020-07-04 ENCOUNTER — Ambulatory Visit: Payer: Self-pay | Admitting: Infectious Disease

## 2020-07-06 ENCOUNTER — Other Ambulatory Visit: Payer: Self-pay | Admitting: Infectious Disease

## 2020-07-06 DIAGNOSIS — B20 Human immunodeficiency virus [HIV] disease: Secondary | ICD-10-CM

## 2020-07-07 ENCOUNTER — Telehealth: Payer: Self-pay

## 2020-07-07 NOTE — Telephone Encounter (Signed)
Left patient a voice mail to call back to offer lab appointment 2 week before his office visit with Dr. Daiva Eves on 08/01/20. Need to ask patient if he will need transportation for these 2 visit's.

## 2020-07-21 ENCOUNTER — Telehealth: Payer: Self-pay

## 2020-07-21 NOTE — Telephone Encounter (Signed)
Patient called requesting refill for Triumeq. RN advised patient that upcoming follow up appointment must be kept in order to obtain future refills. RN confirmed with [facility] that patient has medication ready to be picked up. Attempted to call patient back to let him know, no answer. Left HIPAA compliant voicemail requesting callback.   Sandie Ano, RN

## 2020-07-23 ENCOUNTER — Other Ambulatory Visit: Payer: Self-pay

## 2020-07-23 ENCOUNTER — Telehealth: Payer: Self-pay

## 2020-07-23 DIAGNOSIS — B20 Human immunodeficiency virus [HIV] disease: Secondary | ICD-10-CM

## 2020-07-23 MED ORDER — TRIUMEQ 600-50-300 MG PO TABS: 1.0000 | ORAL_TABLET | Freq: Every day | ORAL | 0 refills | Status: DC

## 2020-07-23 NOTE — Telephone Encounter (Signed)
Patient called office today stating he is having issues getting his Triumeq from pharmacy. Has missed two days of medication and is not sure what he needs to do. Per chart RN spoke with pharmacy on 4/25 and confirmed medication was ready for patient.  CMA called [facility] to confirm Rx is still ready. Pharmacy tech does see an active prescription and can fill medication today. Patient does not have an active mobil number. Is currently only able to receive call over wifi.  Will have Kelby Fam reach out to patient to help relay information.  Juanita Laster, RMA

## 2020-08-01 ENCOUNTER — Ambulatory Visit (INDEPENDENT_AMBULATORY_CARE_PROVIDER_SITE_OTHER): Payer: Self-pay | Admitting: Infectious Disease

## 2020-08-01 ENCOUNTER — Other Ambulatory Visit: Payer: Self-pay

## 2020-08-01 ENCOUNTER — Encounter: Payer: Self-pay | Admitting: Infectious Disease

## 2020-08-01 ENCOUNTER — Ambulatory Visit (INDEPENDENT_AMBULATORY_CARE_PROVIDER_SITE_OTHER): Payer: Self-pay

## 2020-08-01 VITALS — BP 112/71 | HR 78 | Temp 97.9°F | Resp 15 | Ht 65.0 in | Wt 163.6 lb

## 2020-08-01 DIAGNOSIS — B20 Human immunodeficiency virus [HIV] disease: Secondary | ICD-10-CM

## 2020-08-01 DIAGNOSIS — Z23 Encounter for immunization: Secondary | ICD-10-CM

## 2020-08-01 DIAGNOSIS — K047 Periapical abscess without sinus: Secondary | ICD-10-CM

## 2020-08-01 DIAGNOSIS — F321 Major depressive disorder, single episode, moderate: Secondary | ICD-10-CM

## 2020-08-01 DIAGNOSIS — Z653 Problems related to other legal circumstances: Secondary | ICD-10-CM

## 2020-08-01 HISTORY — DX: Periapical abscess without sinus: K04.7

## 2020-08-01 MED ORDER — DOVATO 50-300 MG PO TABS: 1.0000 | ORAL_TABLET | Freq: Every day | ORAL | 11 refills | Status: DC

## 2020-08-01 MED ORDER — AMOXICILLIN-POT CLAVULANATE 875-125 MG PO TABS: 1.0000 | ORAL_TABLET | Freq: Two times a day (BID) | ORAL | 2 refills | Status: DC

## 2020-08-01 NOTE — Progress Notes (Signed)
Subjective:  Chief complaints: Dental pain also concerns about money being demanded of him from his 42 year old child's mother  Patient ID: Herbert Ellison, male    DOB: 05/27/1980, 40 y.o.   MRN: [id number]  HPI   Herbert Ellison is a 40 year old Latino man living with HIV that is been perfectly controlled on Triumeq.  He apparently has had portions of his paycheck sent to his 54 year old child's mother on a regular basis but now is no longer employed and if I understand correctly there is a court case pending though it is a bit puzzling since the child is clearly now an adult.  He is asking for a letter to help him with this.  I asked for tried health project to talk to him because I understand that there is a program that [employer] where they have legal aid that can be providedour people living with HIV who are uninsured.  Also has significant pain in his gum and appears to have a dental abscess.  He clearly needs to be seen by our ambulatory dental team but in the meantime I will start him on Augmentin.  I would also try to switch him from Triumeq to [facility]  Past Medical History:  Diagnosis Date  . Dental caries 06/25/2019  . Depression   . Hay fever    when child  . HIV disease (HCC)     No past surgical history on file.  Family History  Problem Relation Age of Onset  . Congestive Heart Failure Mother        Deceased  . Diabetes Mellitus II Maternal Uncle       Social History   Socioeconomic History  . Marital status: Single    Spouse name: Not on file  . Number of children: Not on file  . Years of education: Not on file  . Highest education level: Not on file  Occupational History  . Not on file  Tobacco Use  . Smoking status: Current Every Day Smoker    Packs/day: 0.50    Types: Cigarettes  . Smokeless tobacco: Never Used  . Tobacco comment: 5 ciggs daily  Substance and Sexual Activity  . Alcohol use: No    Alcohol/week: 0.0 standard drinks   . Drug use: Yes    Frequency: 2.0 times per week    Types: Marijuana  . Sexual activity: Yes    Partners: Female    Comment: given condoms  Other Topics Concern  . Not on file  Social History Narrative  . Not on file   Social Determinants of Health   Financial Resource Strain: Not on file  Food Insecurity: Not on file  Transportation Needs: Not on file  Physical Activity: Not on file  Stress: Not on file  Social Connections: Not on file    No Known Allergies   Current Outpatient Medications:  .  abacavir-dolutegravir-lamiVUDine (TRIUMEQ) 600-50-300 MG tablet, Take 1 tablet by mouth daily., Disp: 30 tablet, Rfl: 0   Review of Systems  Constitutional: Negative for activity change, appetite change, chills, diaphoresis, fatigue, fever and unexpected weight change.  HENT: Positive for dental problem. Negative for congestion, rhinorrhea, sinus pressure, sneezing, sore throat and trouble swallowing.   Eyes: Negative for photophobia and visual disturbance.  Respiratory: Negative for cough, chest tightness, shortness of breath, wheezing and stridor.   Cardiovascular: Negative for chest pain, palpitations and leg swelling.  Gastrointestinal: Negative for abdominal distention, abdominal pain, anal bleeding, blood in stool, constipation, diarrhea, nausea and  vomiting.  Genitourinary: Negative for difficulty urinating, dysuria, flank pain and hematuria.  Musculoskeletal: Negative for arthralgias, back pain, gait problem, joint swelling and myalgias.  Skin: Negative for color change, pallor, rash and wound.  Neurological: Negative for dizziness, tremors, weakness and light-headedness.  Hematological: Negative for adenopathy. Does not bruise/bleed easily.  Psychiatric/Behavioral: Positive for dysphoric mood. Negative for agitation, behavioral problems, confusion, decreased concentration and sleep disturbance.       Objective:   Physical Exam Constitutional:      Appearance: He is  well-developed.  HENT:     Head: Normocephalic and atraumatic.     Mouth/Throat:     Dentition: Abnormal dentition. Dental abscesses present.   Eyes:     Conjunctiva/sclera: Conjunctivae normal.  Cardiovascular:     Rate and Rhythm: Normal rate and regular rhythm.  Pulmonary:     Effort: Pulmonary effort is normal. No respiratory distress.     Breath sounds: No wheezing.  Abdominal:     General: There is no distension.     Palpations: Abdomen is soft.  Musculoskeletal:        General: No tenderness. Normal range of motion.     Cervical back: Normal range of motion and neck supple.  Skin:    General: Skin is warm and dry.     Coloration: Skin is not pale.     Findings: No erythema or rash.  Neurological:     General: No focal deficit present.     Mental Status: He is alert and oriented to person, place, and time.  Psychiatric:        Mood and Affect: Mood normal.        Behavior: Behavior normal.        Thought Content: Thought content normal.        Judgment: Judgment normal.           Assessment & Plan:  Legal problems with regards to child support of his 42 year old: Referring to [facility] and try to get him connected to [facility] legal aid.  Dental abscess: Prescribing Augmentin and try to get him in with our ambulatory dental team.  HIV disease simplify from Triumeq to Dovato to get rid of the  Abacavir   Depression,reactive to legal problems. Would like him connected to [facility]  Need for vaccination we gave him another [employer] vaccine today.  I spent greater than 40 minutes with the patient including greater than 50% of time in face to face counsel of the patient re the problems listed above and in coordination of his care.

## 2020-08-01 NOTE — Progress Notes (Signed)
   Covid-19 Vaccination Clinic  Name:  Kylyn Sookram    MRN: [id number] DOB: 10/19/1980  08/01/2020  Mr. Cubero-Aldarondo was observed post Covid-19 immunization for 15 minutes without incident. He was provided with Vaccine Information Sheet and instruction to access the V-Safe system.   Mr. Wynell Balloon was instructed to call 911 with any severe reactions post vaccine: Marland Kitchen Difficulty breathing  . Swelling of face and throat  . A fast heartbeat  . A bad rash all over body  . Dizziness and weakness

## 2020-08-04 LAB — COMPLETE METABOLIC PANEL WITH GFR
AG Ratio: 1.8 (calc) (ref 1.0–2.5)
ALT: 12 U/L (ref 9–46)
AST: 12 U/L (ref 10–40)
Albumin: 4.7 g/dL (ref 3.6–5.1)
Alkaline phosphatase (APISO): 59 U/L (ref 36–130)
BUN: 11 mg/dL (ref 7–25)
CO2: 29 mmol/L (ref 20–32)
Calcium: 9.7 mg/dL (ref 8.6–10.3)
Chloride: 106 mmol/L (ref 98–110)
Creat: 1.16 mg/dL (ref 0.60–1.35)
GFR, Est African American: 90 mL/min/{1.73_m2} (ref >=60)
GFR, Est Non African American: 78 mL/min/{1.73_m2} (ref >=60)
Globulin: 2.6 g/dL (calc) (ref 1.9–3.7)
Glucose, Bld: 97 mg/dL (ref 65–99)
Potassium: 4.8 mmol/L (ref 3.5–5.3)
Sodium: 141 mmol/L (ref 135–146)
Total Bilirubin: 0.2 mg/dL (ref 0.2–1.2)
Total Protein: 7.3 g/dL (ref 6.1–8.1)

## 2020-08-04 LAB — CBC WITH DIFFERENTIAL/PLATELET
Absolute Monocytes: 488 cells/uL (ref 200–950)
Basophils Absolute: 42 cells/uL (ref 0–200)
Basophils Relative: 0.8 %
Eosinophils Absolute: 90 cells/uL (ref 15–500)
Eosinophils Relative: 1.7 %
HCT: 42.6 % (ref 38.5–50.0)
Hemoglobin: 14.1 g/dL (ref 13.2–17.1)
Lymphs Abs: 1696 cells/uL (ref 850–3900)
MCH: 30.4 pg (ref 27.0–33.0)
MCHC: 33.1 g/dL (ref 32.0–36.0)
MCV: 91.8 fL (ref 80.0–100.0)
MPV: 10.7 fL (ref 7.5–12.5)
Monocytes Relative: 9.2 %
Neutro Abs: 2984 cells/uL (ref 1500–7800)
Neutrophils Relative %: 56.3 %
Platelets: 245 10*3/uL (ref 140–400)
RBC: 4.64 10*6/uL (ref 4.20–5.80)
RDW: 13.9 % (ref 11.0–15.0)
Total Lymphocyte: 32 %
WBC: 5.3 10*3/uL (ref 3.8–10.8)

## 2020-08-04 LAB — RPR: RPR Ser Ql: NONREACTIVE

## 2020-08-04 LAB — URINE CYTOLOGY ANCILLARY ONLY
Chlamydia: NEGATIVE
Comment: NEGATIVE
Comment: NORMAL
Neisseria Gonorrhea: NEGATIVE

## 2020-08-04 LAB — HIV-1 RNA QUANT-NO REFLEX-BLD
HIV 1 RNA Quant: <20 Copies/mL — ABNORMAL HIGH
HIV-1 RNA Quant, Log: <1.3 Log cps/mL — ABNORMAL HIGH

## 2020-08-04 LAB — T-HELPER CELLS (CD4) COUNT (NOT AT ARMC)
Absolute CD4: 344 cells/uL — ABNORMAL LOW (ref 490–1740)
CD4 T Helper %: 22 % — ABNORMAL LOW (ref 30–61)
Total lymphocyte count: 1595 cells/uL (ref 850–3900)

## 2020-08-04 LAB — HEPATITIS B SURFACE ANTIBODY, QUANTITATIVE: Hep B S AB Quant (Post): <5 m[IU]/mL — ABNORMAL LOW (ref >=10)

## 2020-09-08 ENCOUNTER — Other Ambulatory Visit (HOSPITAL_COMMUNITY): Payer: Self-pay

## 2020-09-08 ENCOUNTER — Other Ambulatory Visit: Payer: Self-pay | Admitting: Infectious Diseases

## 2020-09-08 DIAGNOSIS — B20 Human immunodeficiency virus [HIV] disease: Secondary | ICD-10-CM

## 2020-09-08 MED ORDER — SODIUM FLUORIDE 1.1 % DT CREA
TOPICAL_CREAM | DENTAL | 5 refills | Status: AC
  Filled 2020-09-08: qty 51, 30d supply, fill #0

## 2020-10-02 ENCOUNTER — Ambulatory Visit: Payer: Self-pay | Admitting: Infectious Disease

## 2020-10-13 ENCOUNTER — Ambulatory Visit (INDEPENDENT_AMBULATORY_CARE_PROVIDER_SITE_OTHER): Payer: Self-pay | Admitting: Internal Medicine

## 2020-10-13 ENCOUNTER — Encounter: Payer: Self-pay | Admitting: Internal Medicine

## 2020-10-13 ENCOUNTER — Other Ambulatory Visit: Payer: Self-pay

## 2020-10-13 VITALS — BP 96/61 | HR 80 | Temp 98.1°F | Wt 161.2 lb

## 2020-10-13 DIAGNOSIS — Z23 Encounter for immunization: Secondary | ICD-10-CM

## 2020-10-13 DIAGNOSIS — R21 Rash and other nonspecific skin eruption: Secondary | ICD-10-CM

## 2020-10-13 DIAGNOSIS — B20 Human immunodeficiency virus [HIV] disease: Secondary | ICD-10-CM

## 2020-10-13 NOTE — Assessment & Plan Note (Signed)
Patient with rash that appears c/w herpes but currently has self resolved.  Advised patient to monitor and let us know if recurs so we could treat as needed with Valtrex and consider swabbing lesion to obtain diagnosis.  Will also check RPR and GC/CT today.

## 2020-10-13 NOTE — Assessment & Plan Note (Signed)
Will continue Dovato and recheck HIV RNA and CMP today.  Advised pt that rash very unlikely to be due to Dovato as it contains same medications as his previous Triumeq (minus Abacavir)

## 2020-10-13 NOTE — Assessment & Plan Note (Signed)
Patient was to meet with research regarding Beeh[employer] study however they did not have Spanish language consent forms so will be reaching out to do so.

## 2020-10-13 NOTE — Progress Notes (Signed)
[facility]   CHIEF COMPLAINT    HIV follow up.    SUBJECTIVE:    Herbert Ellison is a 40 y.o. male with PMHx as below who presents to the clinic for HIV follow up.   Patient typically follows up with Dr. Daiva Eves.  He was most recently seen on Aug 01, 2020.  At that visit, he was simplified from Triumeq to [facility].  His viral load at that time was less than 20 and his CD4 count was 344.  He reports that the Dovato caused a rash in his genital area that comes and goes.  Currently he does not have a rash. He describes penile lesions that were painful when showering but otherwise did not notice significant pain.  He is still taking the Dovato. No new partners.  Reports being sexually active with women only.  Denies history of syphilis or herpes.  Reports with rash feeling itchy but no other rashes noted.   Please see A&P for the details of today's visit and status of the patient's medical problems.   Patient's Medications  New Prescriptions   No medications on file  Previous Medications   DOLUTEGRAVIR-LAMIVUDINE (DOVATO) 50-300 MG TABS    Take 1 tablet by mouth daily.   SODIUM FLUORIDE (SODIUM FLUORIDE 5000 PLUS) 1.1 % CREA DENTAL CREAM    Use a pea-size amount of toothpaste and brush at evening homecare.  Do not rinse.  Modified Medications   No medications on file  Discontinued Medications   AMOXICILLIN-CLAVULANATE (AUGMENTIN) 875-125 MG TABLET    Take 1 tablet by mouth 2 (two) times daily.      Past Medical History:  Diagnosis Date   Dental abscess 08/01/2020   Dental caries 06/25/2019   Depression    Hay fever    when child   HIV disease (HCC)     Social History   Tobacco Use   Smoking status: Every Day    Packs/day: 0.50    Types: Cigarettes   Smokeless tobacco: Never   Tobacco comments:    5 ciggs daily  Substance Use Topics   Alcohol use: No    Alcohol/week: 0.0 standard drinks   Drug use: Yes    Frequency: 2.0 times  per week    Types: Marijuana    Family History  Problem Relation Age of Onset   Congestive Heart Failure Mother        Deceased   Diabetes Mellitus II Maternal Uncle     No Known Allergies  Review of Systems  Constitutional:  Negative for chills and fever.  Respiratory: Negative.    Cardiovascular: Negative.   Gastrointestinal: Negative.   Genitourinary:  Negative for dysuria, frequency and hematuria.  Skin:  Positive for rash.  All other systems reviewed and are negative.   OBJECTIVE:    Vitals:   10/13/20 0847  BP: 96/61  Pulse: 80  Temp: 98.1 F (36.7 C)  TempSrc: Oral  Weight: 161 lb 3.2 oz (73.1 kg)     Body mass index is 26.83 kg/m.  Physical Exam Constitutional:      General: He is not in acute distress.    Appearance: Normal appearance.  Pulmonary:     Effort: Pulmonary effort is normal. No respiratory distress.  Genitourinary:    Comments: Pictures reviewed on his phone show lesions on tip of penis that appear c/w with herpes type lesion.  He currently does not have a rash.  Neurological:     General: No focal deficit present.     Mental Status: He is alert and oriented to person, place, and time.  Psychiatric:        Mood and Affect: Mood normal.        Behavior: Behavior normal.    Labs and Microbiology: CMP Latest Ref Rng & Units 08/01/2020 04/15/2020 03/26/2019  Glucose 65 - 99 mg/dL 97 161(W) 960(A)  BUN 7 - 25 mg/dL 11 11 9   Creatinine 0.60 - 1.35 mg/dL 5.40 9.81  Sodium 135 - 146 mmol/L 141 136 141  Potassium 3.5 - 5.3 mmol/L 4.8 3.9 4.3  Chloride 98 - 110 mmol/L 106 100 106  CO2 20 - 32 mmol/L 29 29 28   Calcium 8.6 - 10.3 mg/dL 9.7 9.9 9.6  Total Protein 6.1 - 8.1 g/dL 7.3 7.6 7.1  Total Bilirubin 0.2 - 1.2 mg/dL 0.2 0.2 0.5  Alkaline Phos 40 - 115 U/L - - -  AST 10 - 40 U/L 12 11 13   ALT 9 - 46 U/L 12 13 12    CBC Latest Ref Rng & Units 08/01/2020 04/15/2020 03/26/2019  WBC 3.8 - 10.8 Thousand/uL 5.3 6.9 5.4  Hemoglobin 13.2 -  17.1 g/dL 10/01/2020 04/17/2020  Hematocrit 38.5 - 50.0 % 42.6 42.7 41.7  Platelets 140 - 400 Thousand/uL 245 275 250     Lab Results  Component Value Date   HIV1RNAQUANT <20 (H) 08/01/2020   HIV1RNAQUANT <20 04/15/2020   HIV1RNAQUANT <20 NOT DETECTED 03/26/2019   CD4TABS 323 (L) 04/15/2020   CD4TABS 263 (L) 03/26/2019   CD4TABS 376 (L) 10/03/2018    RPR and STI: Lab Results  Component Value Date   LABRPR NON-REACTIVE 08/01/2020   LABRPR NON-REACTIVE 04/15/2020   LABRPR NON-REACTIVE 03/26/2019   LABRPR NON-REACTIVE 10/03/2018   LABRPR NON REAC 08/14/2015    STI Results GC CT  08/01/2020 Negative Negative  10/03/2018 Negative Negative  05/23/2014 NG: Negative CT: Negative    Hepatitis B: Lab Results  Component Value Date   HEPBSAB NEGATIVE 02/21/2012   HEPBSAG NEGATIVE 02/21/2012   HEPBCAB NEGATIVE 02/21/2012   Hepatitis C: No results found for: HEPCAB, HCVRNAPCRQN Hepatitis A: Lab Results  Component Value Date   HAV POSITIVE (A) 02/21/2012   Lipids: Lab Results  Component Value Date   CHOL 220 (H) 08/14/2015   TRIG 112 08/14/2015   HDL 47 08/14/2015   CHOLHDL 4.7 08/14/2015   VLDL 22 08/14/2015   LDLCALC 151 (H) 08/14/2015    ASSESSMENT & PLAN:    HIV disease Will continue Dovato and recheck HIV RNA and CMP today.  Advised pt that rash very unlikely to be due to Dovato as it contains same medications as his previous Triumeq (minus Abacavir)  Rash Patient with rash that appears c/w herpes but currently has self resolved.  Advised patient to monitor and let 08/16/2015 know if recurs so we could treat as needed with Valtrex and consider swabbing lesion to obtain diagnosis.  Will also check RPR and GC/CT today.   Need for hepatitis B booster vaccination Patient was to meet with research regarding [employer] study however they did not have Spanish language consent forms so will be reaching out to do so.   Orders Placed This Encounter  Procedures   COMPLETE METABOLIC PANEL  WITH GFR   HIV-1 RNA quant-no reflex-bld   RPR      08/16/2015 for Infectious Disease [facility] 10/13/2020, 9:35 AM

## 2020-10-13 NOTE — Patient Instructions (Signed)
Thank you for coming to see me today. It was a pleasure seeing you.  To Do: Please continue Dovato Monitor for recurrence of rash/lesions If this occurs, please let us know Labs and urine today Follow up in 4-6 weeks with Dr Daiva Eves  If you have any questions or concerns, please do not hesitate to call the office at [phone].  Take Care,   Gwynn Burly

## 2020-10-14 LAB — URINE CYTOLOGY ANCILLARY ONLY
Chlamydia: NEGATIVE
Comment: NEGATIVE
Comment: NORMAL
Neisseria Gonorrhea: NEGATIVE

## 2020-10-15 LAB — COMPLETE METABOLIC PANEL WITH GFR
AG Ratio: 1.7 (calc) (ref 1.0–2.5)
ALT: 28 U/L (ref 9–46)
AST: 93 U/L — ABNORMAL HIGH (ref 10–40)
Albumin: 4.8 g/dL (ref 3.6–5.1)
Alkaline phosphatase (APISO): 64 U/L (ref 36–130)
BUN: 10 mg/dL (ref 7–25)
CO2: 28 mmol/L (ref 20–32)
Calcium: 9.8 mg/dL (ref 8.6–10.3)
Chloride: 103 mmol/L (ref 98–110)
Creat: 1.19 mg/dL (ref 0.60–1.29)
Globulin: 2.9 g/dL (calc) (ref 1.9–3.7)
Glucose, Bld: 115 mg/dL — ABNORMAL HIGH (ref 65–99)
Potassium: 3.9 mmol/L (ref 3.5–5.3)
Sodium: 139 mmol/L (ref 135–146)
Total Bilirubin: 0.6 mg/dL (ref 0.2–1.2)
Total Protein: 7.7 g/dL (ref 6.1–8.1)
eGFR: 78 mL/min/{1.73_m2} (ref >=60)

## 2020-10-15 LAB — HIV-1 RNA QUANT-NO REFLEX-BLD
HIV 1 RNA Quant: 31 Copies/mL — ABNORMAL HIGH
HIV-1 RNA Quant, Log: 1.49 Log cps/mL — ABNORMAL HIGH

## 2020-10-15 LAB — RPR: RPR Ser Ql: NONREACTIVE

## 2020-10-17 ENCOUNTER — Telehealth: Payer: Self-pay

## 2020-10-17 NOTE — Telephone Encounter (Signed)
Called patient to relay results per Dr. Earlene Plater. No answer and voicemail box not set up.   Sandie Ano, RN

## 2020-10-17 NOTE — Telephone Encounter (Signed)
-----   Message from Kathlynn Grate, DO sent at 10/17/2020 10:01 AM EDT ----- Please let patient know that his labs look pretty good.  Viral load with a blip just above detection and he should continue his Dovato.  Please remind him to follow up with Dr Daiva Eves as scheduled.  His AST was also elevated with normal ALT and no other concerning findings on CMP.  Should be repeated at follow up and encourage EtOH reduction if he drinks alcohol.

## 2020-10-22 NOTE — Telephone Encounter (Signed)
Attempted to call patient again, no answer and no voicemail set up.  Sandie Ano, RN

## 2020-10-24 NOTE — Telephone Encounter (Signed)
Patient advised of lab results with Spanish interpreter. Patient verbalized understanding. I also advised the patient watch his alcohol intake. Patient stated he does not drink a lot of alcohol. Eudell Mcphee T Pricilla Loveless

## 2020-11-28 ENCOUNTER — Ambulatory Visit (INDEPENDENT_AMBULATORY_CARE_PROVIDER_SITE_OTHER): Payer: Self-pay | Admitting: Infectious Disease

## 2020-11-28 ENCOUNTER — Encounter: Payer: Self-pay | Admitting: Infectious Disease

## 2020-11-28 ENCOUNTER — Other Ambulatory Visit: Payer: Self-pay

## 2020-11-28 VITALS — BP 137/81 | HR 65 | Temp 98.1°F | Wt 164.0 lb

## 2020-11-28 DIAGNOSIS — K029 Dental caries, unspecified: Secondary | ICD-10-CM

## 2020-11-28 DIAGNOSIS — L299 Pruritus, unspecified: Secondary | ICD-10-CM

## 2020-11-28 DIAGNOSIS — R1032 Left lower quadrant pain: Secondary | ICD-10-CM | POA: Insufficient documentation

## 2020-11-28 DIAGNOSIS — B20 Human immunodeficiency virus [HIV] disease: Secondary | ICD-10-CM

## 2020-11-28 DIAGNOSIS — F32A Depression, unspecified: Secondary | ICD-10-CM

## 2020-11-28 DIAGNOSIS — R7401 Elevation of levels of liver transaminase levels: Secondary | ICD-10-CM

## 2020-11-28 DIAGNOSIS — R21 Rash and other nonspecific skin eruption: Secondary | ICD-10-CM

## 2020-11-28 DIAGNOSIS — M791 Myalgia, unspecified site: Secondary | ICD-10-CM

## 2020-11-28 HISTORY — DX: Elevation of levels of liver transaminase levels: R74.01

## 2020-11-28 HISTORY — DX: Pruritus, unspecified: L29.9

## 2020-11-28 HISTORY — DX: Myalgia, unspecified site: M79.10

## 2020-11-28 HISTORY — DX: Left lower quadrant pain: R10.32

## 2020-11-28 MED ORDER — GABAPENTIN 300 MG PO CAPS: 300.0000 mg | ORAL_CAPSULE | Freq: Every day | ORAL | 11 refills | Status: DC

## 2020-11-28 MED ORDER — DOVATO 50-300 MG PO TABS: 1.0000 | ORAL_TABLET | Freq: Every day | ORAL | 11 refills | Status: DC

## 2020-11-28 MED ORDER — AMOXICILLIN-POT CLAVULANATE 875-125 MG PO TABS: 1.0000 | ORAL_TABLET | Freq: Two times a day (BID) | ORAL | 1 refills | Status: AC

## 2020-11-28 NOTE — Progress Notes (Signed)
Subjective:  Chief complaints: Itching which she attributes to starting Dovato, left sided abdominal pain which he believes is due to his kidneys, concerned about his kidney function, dental pain concerned about dental infection diffuse body aches and pain after working   Patient ID: Herbert Ellison, male    DOB: 01-02-1981, 40 y.o.   MRN: [id number]  HPI  Herbert Ellison is a 40 year old Latino man with HIV that has been perfectly controlled most recently on TRIUMEQ switch to Dovato.  After he made the switch from Triumeq to [facility] he was having some intermittent bouts with itching and particular in his groin as documented at his visit with Dr. Earlene Plater.  I do not think that his itching is related to Dovato at all given that the components of Dovato are essentially the same minus abacavir as is found in TRIUMEQ.  And less it was somehow related to a coating or color additive to the pill.  In discussing him his rashes and his itching I do not think that they are related at all to the Dovato.  He was also called back after labs were done at his last visit because his AST was elevated in the 90 range with a normal ALT.  When he received a phone call it seems that he misunderstood and thought that the phone call was about his kidney function I showed him that his creatinine has been normal the whole time he been taking care of him and that there is no concern about kidney function.  He does describe some left-sided abdominal pain and sometimes in the right side as well with radiation to the groin which sounds like it could be kidney stones though the severity is not as consistent as 1 would think with kidney stones.  He also does not have a history of hematuria.    Tells me that he has 2 teeth that need to be worked out he is worried that 1 now has become infected.  He also is having problems sleeping at the end of the day after work long day of work because he has diffuse body aches and asked if  he could take something besides 800 mg of ibuprofen.  Past Medical History:  Diagnosis Date   Dental abscess 08/01/2020   Dental caries 06/25/2019   Depression    Hay fever    when child   HIV disease (HCC)    LLQ pain 11/28/2020   Myalgia 11/28/2020   Pruritic condition 11/28/2020   Transaminitis 11/28/2020    No past surgical history on file.  Family History  Problem Relation Age of Onset   Congestive Heart Failure Mother        Deceased   Diabetes Mellitus II Maternal Uncle       Social History   Socioeconomic History   Marital status: Single    Spouse name: Not on file   Number of children: Not on file   Years of education: Not on file   Highest education level: Not on file  Occupational History   Not on file  Tobacco Use   Smoking status: Every Day    Packs/day: 0.50    Types: Cigarettes   Smokeless tobacco: Never   Tobacco comments:    5 ciggs daily  Substance and Sexual Activity   Alcohol use: No    Alcohol/week: 0.0 standard drinks   Drug use: Yes    Frequency: 2.0 times per week    Types: Marijuana   Sexual  activity: Yes    Partners: Female    Comment: DECLINED CONDOMS  Other Topics Concern   Not on file  Social History Narrative   Not on file   Social Determinants of Health   Financial Resource Strain: Not on file  Food Insecurity: Not on file  Transportation Needs: Not on file  Physical Activity: Not on file  Stress: Not on file  Social Connections: Not on file    No Known Allergies   Current Outpatient Medications:    amoxicillin-clavulanate (AUGMENTIN) 875-125 MG tablet, Take 1 tablet by mouth 2 (two) times daily for 14 days., Disp: 28 tablet, Rfl: 1   gabapentin (NEURONTIN) 300 MG capsule, Take 1 capsule (300 mg total) by mouth at bedtime., Disp: 30 capsule, Rfl: 11   Dolutegravir-lamiVUDine (DOVATO) 50-300 MG TABS, Take 1 tablet by mouth daily., Disp: 30 tablet, Rfl: 11   sodium fluoride (SODIUM FLUORIDE 5000 PLUS) 1.1 % CREA dental cream,  Use a pea-size amount of toothpaste and brush at evening homecare.  Do not rinse. (Patient not taking: Reported on 10/13/2020), Disp: 51 g, Rfl: 5   Review of Systems  Constitutional:  Negative for activity change, appetite change, chills, diaphoresis, fatigue, fever and unexpected weight change.  HENT:  Positive for dental problem. Negative for congestion, rhinorrhea, sinus pressure, sneezing, sore throat and trouble swallowing.   Eyes:  Negative for photophobia and visual disturbance.  Respiratory:  Negative for cough, chest tightness, shortness of breath, wheezing and stridor.   Cardiovascular:  Negative for chest pain, palpitations and leg swelling.  Gastrointestinal:  Positive for abdominal pain. Negative for abdominal distention, anal bleeding, blood in stool, constipation, diarrhea, nausea and vomiting.  Genitourinary:  Negative for difficulty urinating, dysuria, flank pain and hematuria.  Musculoskeletal:  Positive for arthralgias. Negative for back pain, gait problem, joint swelling and myalgias.  Skin:  Positive for rash. Negative for color change, pallor and wound.  Neurological:  Negative for dizziness, tremors, weakness and light-headedness.  Hematological:  Negative for adenopathy. Does not bruise/bleed easily.  Psychiatric/Behavioral:  Positive for sleep disturbance. Negative for agitation, behavioral problems, confusion, decreased concentration, dysphoric mood and hallucinations. The patient is not nervous/anxious.       Objective:   Physical Exam Constitutional:      General: He is not in acute distress.    Appearance: Normal appearance. He is well-developed. He is not ill-appearing or diaphoretic.  HENT:     Head: Normocephalic and atraumatic.     Right Ear: Hearing and external ear normal.     Left Ear: Hearing and external ear normal.     Nose: No nasal deformity or rhinorrhea.     Mouth/Throat:     Dentition: Abnormal dentition.     Comments: One of teeth has  edematous gumline Eyes:     General: No scleral icterus.       Right eye: No discharge.        Left eye: No discharge.     Extraocular Movements: Extraocular movements intact.     Conjunctiva/sclera: Conjunctivae normal.     Right eye: Right conjunctiva is not injected.     Left eye: Left conjunctiva is not injected.     Pupils: Pupils are equal, round, and reactive to light.  Neck:     Vascular: No JVD.  Cardiovascular:     Rate and Rhythm: Normal rate and regular rhythm.     Heart sounds: S1 normal and S2 normal.    Friction rub present.  Pulmonary:     Effort: Pulmonary effort is normal. No respiratory distress.     Breath sounds: No wheezing or rales.  Abdominal:     General: Bowel sounds are normal. There is no distension.     Palpations: Abdomen is soft. There is no mass.     Tenderness: There is no abdominal tenderness. There is no guarding.     Hernia: No hernia is present.  Musculoskeletal:        General: Normal range of motion.     Right shoulder: Normal.     Left shoulder: Normal.     Cervical back: Normal range of motion and neck supple.     Right hip: Normal.     Left hip: Normal.     Right knee: Normal.     Left knee: Normal.  Lymphadenopathy:     Head:     Right side of head: No submandibular, preauricular or posterior auricular adenopathy.     Left side of head: No submandibular, preauricular or posterior auricular adenopathy.     Cervical: No cervical adenopathy.     Right cervical: No superficial or deep cervical adenopathy.    Left cervical: No superficial or deep cervical adenopathy.  Skin:    General: Skin is warm and dry.     Coloration: Skin is not pale.     Findings: No abrasion, bruising, ecchymosis, erythema, lesion or rash.     Nails: There is no clubbing.  Neurological:     General: No focal deficit present.     Mental Status: He is alert and oriented to person, place, and time.     Sensory: No sensory deficit.     Coordination:  Coordination normal.     Gait: Gait normal.  Psychiatric:        Attention and Perception: He is attentive.        Mood and Affect: Mood normal.        Speech: Speech normal.        Behavior: Behavior normal. Behavior is cooperative.        Thought Content: Thought content normal.        Judgment: Judgment normal.          Assessment & Plan:   HIV disease:  We will repeat a viral load today he had a little bit of a blip last time he had labs done  Repeating his CD4 count today  I am continuing his Dovato  Left-sided abdominal pain: Does not sound like constipation from history severity does not sound like it would be consistent with diverticulitis I will recheck a CMP as well as an amylase and lipase.  We will check a UA in case he does indeed have kidney stones.  Transaminitis: He says he does not drink alcohol and has not for years not clear what this isolated AST elevation is due to we will repeat CMP today along with other above labs.  We will also check a hepatitis C antibody he is immune to hep a and B  Dental infection:  I am rx him 14 days of Augmentin and he is seeing the dentist  Myalgias: I am rx him gabapentin  Itching: this is assuredly not related to his Dovato  Hyperglycemia on labs in July we will check an A1c

## 2020-11-29 LAB — URINALYSIS, ROUTINE W REFLEX MICROSCOPIC
Bilirubin Urine: NEGATIVE
Glucose, UA: NEGATIVE
Hgb urine dipstick: NEGATIVE
Ketones, ur: NEGATIVE
Leukocytes,Ua: NEGATIVE
Nitrite: NEGATIVE
Protein, ur: NEGATIVE
Specific Gravity, Urine: 1.008 (ref 1.001–1.035)
pH: 6.5 (ref 5.0–8.0)

## 2020-12-02 LAB — CBC WITH DIFFERENTIAL/PLATELET
Absolute Monocytes: 448 cells/uL (ref 200–950)
Basophils Absolute: 39 cells/uL (ref 0–200)
Basophils Relative: 0.7 %
Eosinophils Absolute: 112 cells/uL (ref 15–500)
Eosinophils Relative: 2 %
HCT: 43.1 % (ref 38.5–50.0)
Hemoglobin: 14.1 g/dL (ref 13.2–17.1)
Lymphs Abs: 1680 cells/uL (ref 850–3900)
MCH: 30.4 pg (ref 27.0–33.0)
MCHC: 32.7 g/dL (ref 32.0–36.0)
MCV: 92.9 fL (ref 80.0–100.0)
MPV: 10.8 fL (ref 7.5–12.5)
Monocytes Relative: 8 %
Neutro Abs: 3321 cells/uL (ref 1500–7800)
Neutrophils Relative %: 59.3 %
Platelets: 255 10*3/uL (ref 140–400)
RBC: 4.64 10*6/uL (ref 4.20–5.80)
RDW: 13.4 % (ref 11.0–15.0)
Total Lymphocyte: 30 %
WBC: 5.6 10*3/uL (ref 3.8–10.8)

## 2020-12-02 LAB — COMPLETE METABOLIC PANEL WITH GFR
AG Ratio: 1.6 (calc) (ref 1.0–2.5)
ALT: 23 U/L (ref 9–46)
AST: 20 U/L (ref 10–40)
Albumin: 4.4 g/dL (ref 3.6–5.1)
Alkaline phosphatase (APISO): 56 U/L (ref 36–130)
BUN: 7 mg/dL (ref 7–25)
CO2: 28 mmol/L (ref 20–32)
Calcium: 9.5 mg/dL (ref 8.6–10.3)
Chloride: 106 mmol/L (ref 98–110)
Creat: 0.96 mg/dL (ref 0.60–1.29)
Globulin: 2.7 g/dL (calc) (ref 1.9–3.7)
Glucose, Bld: 93 mg/dL (ref 65–99)
Potassium: 4.3 mmol/L (ref 3.5–5.3)
Sodium: 141 mmol/L (ref 135–146)
Total Bilirubin: 0.5 mg/dL (ref 0.2–1.2)
Total Protein: 7.1 g/dL (ref 6.1–8.1)
eGFR: 101 mL/min/{1.73_m2} (ref >=60)

## 2020-12-02 LAB — HEMOGLOBIN A1C
Hgb A1c MFr Bld: 5.3 % of total Hgb (ref <5.7)
Mean Plasma Glucose: 105 mg/dL
eAG (mmol/L): 5.8 mmol/L

## 2020-12-02 LAB — T-HELPER CELLS (CD4) COUNT (NOT AT ARMC)
Absolute CD4: 441 cells/uL — ABNORMAL LOW (ref 490–1740)
CD4 T Helper %: 25 % — ABNORMAL LOW (ref 30–61)
Total lymphocyte count: 1796 cells/uL (ref 850–3900)

## 2020-12-02 LAB — HEPATITIS C ANTIBODY
Hepatitis C Ab: NONREACTIVE
SIGNAL TO CUT-OFF: 0.02 (ref <1.00)

## 2020-12-02 LAB — HIV-1 RNA QUANT-NO REFLEX-BLD
HIV 1 RNA Quant: <20 Copies/mL — ABNORMAL HIGH
HIV-1 RNA Quant, Log: <1.3 Log cps/mL — ABNORMAL HIGH

## 2020-12-02 LAB — AMYLASE: Amylase: 36 U/L (ref 21–101)

## 2020-12-02 LAB — LIPASE: Lipase: 15 U/L (ref 7–60)

## 2020-12-02 LAB — RPR: RPR Ser Ql: NONREACTIVE

## 2021-03-06 ENCOUNTER — Other Ambulatory Visit: Payer: Self-pay

## 2021-03-06 ENCOUNTER — Encounter: Payer: Self-pay | Admitting: Infectious Disease

## 2021-03-06 ENCOUNTER — Ambulatory Visit (INDEPENDENT_AMBULATORY_CARE_PROVIDER_SITE_OTHER): Payer: Self-pay

## 2021-03-06 ENCOUNTER — Ambulatory Visit (INDEPENDENT_AMBULATORY_CARE_PROVIDER_SITE_OTHER): Payer: Self-pay | Admitting: Infectious Disease

## 2021-03-06 VITALS — BP 108/74 | HR 67 | Temp 97.8°F | Wt 162.0 lb

## 2021-03-06 DIAGNOSIS — R7401 Elevation of levels of liver transaminase levels: Secondary | ICD-10-CM

## 2021-03-06 DIAGNOSIS — Z23 Encounter for immunization: Secondary | ICD-10-CM

## 2021-03-06 DIAGNOSIS — K0889 Other specified disorders of teeth and supporting structures: Secondary | ICD-10-CM

## 2021-03-06 DIAGNOSIS — G4700 Insomnia, unspecified: Secondary | ICD-10-CM

## 2021-03-06 DIAGNOSIS — Z7185 Encounter for immunization safety counseling: Secondary | ICD-10-CM

## 2021-03-06 DIAGNOSIS — B20 Human immunodeficiency virus [HIV] disease: Secondary | ICD-10-CM

## 2021-03-06 HISTORY — DX: Insomnia, unspecified: G47.00

## 2021-03-06 MED ORDER — DOVATO 50-300 MG PO TABS: 1.0000 | ORAL_TABLET | Freq: Every day | ORAL | 11 refills | Status: DC

## 2021-03-06 MED ORDER — AMOXICILLIN-POT CLAVULANATE 875-125 MG PO TABS: 1.0000 | ORAL_TABLET | Freq: Two times a day (BID) | ORAL | 2 refills | Status: AC

## 2021-03-06 MED ORDER — GABAPENTIN 300 MG PO CAPS: 300.0000 mg | ORAL_CAPSULE | Freq: Every day | ORAL | 11 refills | Status: DC

## 2021-03-06 NOTE — Progress Notes (Signed)
   Covid-19 Vaccination Clinic  Name:  Herbert Ellison    MRN: [id number] DOB: January 21, 1981  03/06/2021  Mr. Herbert Ellison was observed post Covid-19 immunization for 15 minutes without incident. He was provided with Vaccine Information Sheet and instruction to access the V-Safe system.   Mr. Herbert Ellison was instructed to call 911 with any severe reactions post vaccine: Difficulty breathing  Swelling of face and throat  A fast heartbeat  A bad rash all over body  Dizziness and weakness   Immunizations Administered     Name Date Dose VIS Date Route   Pfizer Covid-19 Vaccine Bivalent Booster 03/06/2021  9:21 AM 0.3 mL 11/26/2020 Intramuscular   Manufacturer: [employer], [employer]   Lot: [id number]   NDC: [address]-5      Juanita Laster, RMA

## 2021-03-06 NOTE — Progress Notes (Signed)
Subjective:  Chief complaints: New pain in a tooth in the front of his mouth   Patient ID: Herbert Ellison, male    DOB: 1981/03/06, 40 y.o.   MRN: [id number]  HPI  Herbert Ellison is a 40 year old Latino man with HIV that has been perfectly controlled most recently on TRIUMEQ switch to Dovato.  Several visits ago he was complaining of pruritus that he thought was due to [facility] but he is not complaining of that today.  He is complaining of tooth pain yet again which I treated with Augmentin.  He is scheduled to see dental later this month.  He also is asking if you have stomach for insomnia I had prescribed gabapentin but he does not appear to have picked it up.  Also had some anxiety about his transient elevation of his AST that was seen before but which normalized on subsequent CMP testing.      Past Medical History:  Diagnosis Date   Dental abscess 08/01/2020   Dental caries 06/25/2019   Depression    Hay fever    when child   HIV disease (HCC)    LLQ pain 11/28/2020   Myalgia 11/28/2020   Pruritic condition 11/28/2020   Transaminitis 11/28/2020    No past surgical history on file.  Family History  Problem Relation Age of Onset   Congestive Heart Failure Mother        Deceased   Diabetes Mellitus II Maternal Uncle       Social History   Socioeconomic History   Marital status: Single    Spouse name: Not on file   Number of children: Not on file   Years of education: Not on file   Highest education level: Not on file  Occupational History   Not on file  Tobacco Use   Smoking status: Every Day    Packs/day: 0.50    Types: Cigarettes   Smokeless tobacco: Never   Tobacco comments:    5 ciggs daily  Substance and Sexual Activity   Alcohol use: No    Alcohol/week: 0.0 standard drinks   Drug use: Yes    Frequency: 2.0 times per week    Types: Marijuana   Sexual activity: Yes    Partners: Female    Comment: DECLINED CONDOMS  Other Topics Concern   Not on file   Social History Narrative   Not on file   Social Determinants of Health   Financial Resource Strain: Not on file  Food Insecurity: Not on file  Transportation Needs: Not on file  Physical Activity: Not on file  Stress: Not on file  Social Connections: Not on file    No Known Allergies   Current Outpatient Medications:    Dolutegravir-lamiVUDine (DOVATO) 50-300 MG TABS, Take 1 tablet by mouth daily., Disp: 30 tablet, Rfl: 11   gabapentin (NEURONTIN) 300 MG capsule, Take 1 capsule (300 mg total) by mouth at bedtime., Disp: 30 capsule, Rfl: 11   sodium fluoride (SODIUM FLUORIDE 5000 PLUS) 1.1 % CREA dental cream, Use a pea-size amount of toothpaste and brush at evening homecare.  Do not rinse. (Patient not taking: Reported on 10/13/2020), Disp: 51 g, Rfl: 5   Review of Systems  Constitutional:  Negative for activity change, appetite change, chills, diaphoresis, fatigue, fever and unexpected weight change.  HENT:  Positive for dental problem. Negative for congestion, rhinorrhea, sinus pressure, sneezing, sore throat and trouble swallowing.   Eyes:  Negative for photophobia and visual disturbance.  Respiratory:  Negative for cough, chest tightness, shortness of breath, wheezing and stridor.   Cardiovascular:  Negative for chest pain, palpitations and leg swelling.  Gastrointestinal:  Negative for abdominal distention, abdominal pain, anal bleeding, blood in stool, constipation, diarrhea, nausea and vomiting.  Genitourinary:  Negative for difficulty urinating, dysuria, flank pain and hematuria.  Musculoskeletal:  Negative for arthralgias, back pain, gait problem, joint swelling and myalgias.  Skin:  Negative for color change, pallor, rash and wound.  Neurological:  Negative for dizziness, tremors, weakness and light-headedness.  Hematological:  Negative for adenopathy. Does not bruise/bleed easily.  Psychiatric/Behavioral:  Positive for sleep disturbance. Negative for agitation,  behavioral problems, confusion, decreased concentration and dysphoric mood.       Objective:   Physical Exam Constitutional:      Appearance: He is well-developed.  HENT:     Head: Normocephalic and atraumatic.     Mouth/Throat:     Dentition: Abnormal dentition. Dental tenderness present.  Eyes:     Conjunctiva/sclera: Conjunctivae normal.  Cardiovascular:     Rate and Rhythm: Normal rate and regular rhythm.  Pulmonary:     Effort: Pulmonary effort is normal. No respiratory distress.     Breath sounds: No wheezing.  Abdominal:     General: There is no distension.     Palpations: Abdomen is soft.  Musculoskeletal:        General: No tenderness. Normal range of motion.     Cervical back: Normal range of motion and neck supple.  Skin:    General: Skin is warm and dry.     Coloration: Skin is not pale.     Findings: No erythema or rash.  Neurological:     General: No focal deficit present.     Mental Status: He is alert and oriented to person, place, and time.  Psychiatric:        Mood and Affect: Mood normal.        Behavior: Behavior normal.        Thought Content: Thought content normal.        Judgment: Judgment normal.          Assessment & Plan:   HIV disease:  I am checking a viral load today CD4 count CMP and CBC with differential.  I am continuing  his Dovato  Lab Results  Component Value Date   HIV1RNAQUANT <20 (H) 11/28/2020    CD4 reviewed and was 441 Lab Results  Component Value Date   CD4TABS 323 (L) 04/15/2020   CD4TABS 263 (L) 03/26/2019   CD4TABS 376 (L) 10/03/2018    Insomnia:  I am prescribing gabapentin see if this helps him.  Tooth pain: I will give him 2-week course of Augmentin needs to see dental  Vaccine counseling he needs an updated COVID-19 booster as well as a flu shot   Transient elevation of AST: Not clear what this was.  Typically this is more in the pattern of alcoholic hepatitis but he denies ever drinking  alcohol.  It normalized after recheck and he has never had an elevated AST or ALT before.

## 2021-03-09 LAB — T-HELPER CELLS (CD4) COUNT (NOT AT ARMC)
Absolute CD4: 374 cells/uL — ABNORMAL LOW (ref 490–1740)
CD4 T Helper %: 23 % — ABNORMAL LOW (ref 30–61)
Total lymphocyte count: 1620 cells/uL (ref 850–3900)

## 2021-03-09 LAB — CBC WITH DIFFERENTIAL/PLATELET
Absolute Monocytes: 469 cells/uL (ref 200–950)
Basophils Absolute: 51 cells/uL (ref 0–200)
Basophils Relative: 1 %
Eosinophils Absolute: 230 cells/uL (ref 15–500)
Eosinophils Relative: 4.5 %
HCT: 45.1 % (ref 38.5–50.0)
Hemoglobin: 14.9 g/dL (ref 13.2–17.1)
Lymphs Abs: 1561 cells/uL (ref 850–3900)
MCH: 30.5 pg (ref 27.0–33.0)
MCHC: 33 g/dL (ref 32.0–36.0)
MCV: 92.4 fL (ref 80.0–100.0)
MPV: 10.6 fL (ref 7.5–12.5)
Monocytes Relative: 9.2 %
Neutro Abs: 2790 cells/uL (ref 1500–7800)
Neutrophils Relative %: 54.7 %
Platelets: 287 10*3/uL (ref 140–400)
RBC: 4.88 10*6/uL (ref 4.20–5.80)
RDW: 13.1 % (ref 11.0–15.0)
Total Lymphocyte: 30.6 %
WBC: 5.1 10*3/uL (ref 3.8–10.8)

## 2021-03-09 LAB — LIPID PANEL
Cholesterol: 205 mg/dL — ABNORMAL HIGH (ref <200)
HDL: 52 mg/dL (ref >=40)
LDL Cholesterol (Calc): 139 mg/dL (calc) — ABNORMAL HIGH
Non-HDL Cholesterol (Calc): 153 mg/dL (calc) — ABNORMAL HIGH (ref <130)
Total CHOL/HDL Ratio: 3.9 (calc) (ref <5.0)
Triglycerides: 57 mg/dL (ref <150)

## 2021-03-09 LAB — COMPLETE METABOLIC PANEL WITH GFR
AG Ratio: 1.5 (calc) (ref 1.0–2.5)
ALT: 15 U/L (ref 9–46)
AST: 14 U/L (ref 10–40)
Albumin: 4.6 g/dL (ref 3.6–5.1)
Alkaline phosphatase (APISO): 54 U/L (ref 36–130)
BUN: 12 mg/dL (ref 7–25)
CO2: 28 mmol/L (ref 20–32)
Calcium: 10 mg/dL (ref 8.6–10.3)
Chloride: 104 mmol/L (ref 98–110)
Creat: 1.06 mg/dL (ref 0.60–1.29)
Globulin: 3 g/dL (calc) (ref 1.9–3.7)
Glucose, Bld: 98 mg/dL (ref 65–99)
Potassium: 5 mmol/L (ref 3.5–5.3)
Sodium: 140 mmol/L (ref 135–146)
Total Bilirubin: 0.3 mg/dL (ref 0.2–1.2)
Total Protein: 7.6 g/dL (ref 6.1–8.1)
eGFR: 90 mL/min/{1.73_m2} (ref >=60)

## 2021-03-09 LAB — RPR: RPR Ser Ql: NONREACTIVE

## 2021-03-09 LAB — HIV-1 RNA QUANT-NO REFLEX-BLD
HIV 1 RNA Quant: <20 Copies/mL — ABNORMAL HIGH
HIV-1 RNA Quant, Log: <1.3 Log cps/mL — ABNORMAL HIGH

## 2021-04-13 ENCOUNTER — Ambulatory Visit: Payer: Self-pay

## 2021-04-20 ENCOUNTER — Telehealth: Payer: Self-pay

## 2021-04-20 NOTE — Telephone Encounter (Signed)
Patient has rescheduled his appointment to 06/04/21, but was wanting a refill for his Gabapentin . I informed him that he still had 11 refill's at his pharmacy but was told they don't have it on file, he get's both his Gabapentin and Dovato mailed to him but only received his Dovato. Best contact number is [phone]

## 2021-05-13 ENCOUNTER — Ambulatory Visit: Payer: Self-pay | Admitting: Infectious Disease

## 2021-05-15 ENCOUNTER — Telehealth: Payer: Self-pay

## 2021-05-15 NOTE — Telephone Encounter (Signed)
Patient called stating he did not get his medication delivered from the pharmacy. Relayed that he has many refills on file and provided him with pharmacy phone number.   Beryle Flock, RN

## 2021-05-20 ENCOUNTER — Telehealth: Payer: Self-pay

## 2021-05-20 NOTE — Telephone Encounter (Signed)
Patient called stating that he was unable to get his Dovato from [place]. I contacted [facility] and patient has refills on file. I confirmed that they would fill and mail medication to patient address.   Informed patient he has refills on file and [facility] will be mailing his medication to him today. Advised patient if [facility] calls him to refill/deliver his medications to make sure he answers to confirm delivery. Patient voiced his understanding.   Leslie, CMA

## 2021-05-28 ENCOUNTER — Ambulatory Visit: Payer: Self-pay | Admitting: Infectious Disease

## 2021-05-28 ENCOUNTER — Ambulatory Visit: Payer: Self-pay

## 2021-06-10 ENCOUNTER — Ambulatory Visit (INDEPENDENT_AMBULATORY_CARE_PROVIDER_SITE_OTHER): Payer: Self-pay | Admitting: Infectious Disease

## 2021-06-10 ENCOUNTER — Other Ambulatory Visit: Payer: Self-pay

## 2021-06-10 ENCOUNTER — Ambulatory Visit: Payer: Self-pay

## 2021-06-10 ENCOUNTER — Encounter: Payer: Self-pay | Admitting: Infectious Disease

## 2021-06-10 VITALS — BP 115/75 | HR 73 | Temp 98.3°F | Wt 151.0 lb

## 2021-06-10 DIAGNOSIS — M25512 Pain in left shoulder: Secondary | ICD-10-CM

## 2021-06-10 DIAGNOSIS — Z79899 Other long term (current) drug therapy: Secondary | ICD-10-CM

## 2021-06-10 DIAGNOSIS — G8929 Other chronic pain: Secondary | ICD-10-CM

## 2021-06-10 DIAGNOSIS — K089 Disorder of teeth and supporting structures, unspecified: Secondary | ICD-10-CM

## 2021-06-10 DIAGNOSIS — B20 Human immunodeficiency virus [HIV] disease: Secondary | ICD-10-CM

## 2021-06-10 HISTORY — DX: Pain in left shoulder: M25.512

## 2021-06-10 MED ORDER — DOVATO 50-300 MG PO TABS: 1.0000 | ORAL_TABLET | Freq: Every day | ORAL | 11 refills | Status: DC

## 2021-06-10 MED ORDER — GABAPENTIN 300 MG PO CAPS: 300.0000 mg | ORAL_CAPSULE | Freq: Every day | ORAL | 11 refills | Status: DC

## 2021-06-10 NOTE — Progress Notes (Signed)
? ?Subjective:  ?Chief complaints: Left-sided shoulder pain ? ? Patient ID: Herbert Ellison, male    DOB: 05-26-1980, 41 y.o.   MRN: [id number] ? ?HPI ? ?Herbert Ellison is a 41 year old [place] man with HIV that has been perfectly controlled most recently on [place] switch to [place]. ? ?He has had problems with dentition but not yet been seen by our dental clinic still has some pain in his incisors. ? 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  He is only using his right arm. ? ? ? ? ? ? ? ?Past Medical History:  ?Diagnosis Date  ? Dental abscess 08/01/2020  ? Dental caries 06/25/2019  ? Depression   ? Hay fever   ? when child  ? HIV disease ([place])   ? Insomnia 03/06/2021  ? LLQ pain 11/28/2020  ? Myalgia 11/28/2020  ? Pruritic condition 11/28/2020  ? Shoulder pain, left 06/10/2021  ? Transaminitis 11/28/2020  ? ? ?No past surgical history on file. ? ?Family History  ?Problem Relation Age of Onset  ? Congestive Heart Failure Mother   ?     Deceased  ? Diabetes Mellitus II Maternal Uncle   ? ? ?  ?Social History  ? ?Socioeconomic History  ? Marital status: Single  ?  Spouse name: Not on file  ? Number of children: Not on file  ? Years of education: Not on file  ? Highest education level: Not on file  ?Occupational History  ? Not on file  ?Tobacco Use  ? Smoking status: Every Day  ?  Packs/day: 0.50  ?  Types: Cigarettes  ? Smokeless tobacco: Never  ? Tobacco comments:  ?  5 ciggs daily  ?Substance and Sexual Activity  ? Alcohol use: No  ?  Alcohol/week: 0.0 standard drinks  ? Drug use: Yes  ?  Frequency: 2.0 times per week  ?  Types: Marijuana  ? Sexual activity: Yes  ?  Partners: Female  ?  Comment: DECLINED CONDOMS  ?Other Topics Concern  ? Not on file  ?Social History Narrative  ? Not on file  ? ?Social Determinants of Health  ? ?Financial Resource Strain: Not on file  ?Food Insecurity: Not on file  ?Transportation Needs: Not on file   ?Physical Activity: Not on file  ?Stress: Not on file  ?Social Connections: Not on file  ? ? ?No Known Allergies ? ? ?Current Outpatient Medications:  ?  dolutegravir-lamiVUDine (DOVATO) 50-300 MG tablet, Take 1 tablet by mouth daily., Disp: 30 tablet, Rfl: 11 ?  gabapentin (NEURONTIN) 300 MG capsule, Take 1 capsule (300 mg total) by mouth at bedtime., Disp: 30 capsule, Rfl: 11 ?  sodium fluoride (SODIUM FLUORIDE 5000 PLUS) 1.1 % CREA dental cream, Use a pea-size amount of toothpaste and brush at evening homecare.  Do not rinse. (Patient not taking: Reported on 10/13/2020), Disp: 51 g, Rfl: 5 ? ? ?Review of Systems  ?Constitutional:  Negative for activity change, appetite change, chills, diaphoresis, fatigue, fever and unexpected weight change.  ?HENT:  Negative for congestion, rhinorrhea, sinus pressure, sneezing, sore throat and trouble swallowing.   ?Eyes:  Negative for photophobia and visual disturbance.  ?Respiratory:  Negative for cough, chest tightness, shortness of breath, wheezing and stridor.   ?Cardiovascular:  Negative for chest pain, palpitations and leg swelling.  ?Gastrointestinal:  Negative for abdominal distention, abdominal pain, anal bleeding, blood in stool, constipation,  diarrhea, nausea and vomiting.  ?Genitourinary:  Negative for difficulty urinating, dysuria, flank pain and hematuria.  ?Musculoskeletal:  Positive for arthralgias and myalgias. Negative for back pain, gait problem and joint swelling.  ?Skin:  Negative for color change, pallor, rash and wound.  ?Neurological:  Negative for dizziness, tremors, weakness and light-headedness.  ?Hematological:  Negative for adenopathy. Does not bruise/bleed easily.  ?Psychiatric/Behavioral:  Negative for agitation, behavioral problems, confusion, decreased concentration, dysphoric mood and sleep disturbance.   ? ?   ?Objective:  ? Physical Exam ?Constitutional:   ?   Appearance: He is well-developed.  ?HENT:  ?   Head: Normocephalic and atraumatic.   ?Eyes:  ?   Conjunctiva/sclera: Conjunctivae normal.  ?Cardiovascular:  ?   Rate and Rhythm: Normal rate and regular rhythm.  ?Pulmonary:  ?   Effort: Pulmonary effort is normal. No respiratory distress.  ?   Breath sounds: No wheezing.  ?Abdominal:  ?   General: There is no distension.  ?   Palpations: Abdomen is soft.  ?Musculoskeletal:     ?   General: No tenderness.  ?   Left shoulder: No deformity, effusion or laceration. Decreased range of motion.  ?   Cervical back: Normal range of motion and neck supple.  ?   Comments: He appears to have tenderness of the trapezius muscle. ? ?  ?Skin: ?   General: Skin is warm and dry.  ?   Coloration: Skin is not pale.  ?   Findings: No erythema or rash.  ?Neurological:  ?   General: No focal deficit present.  ?   Mental Status: He is alert and oriented to person, place, and time.  ?Psychiatric:     ?   Mood and Affect: Mood normal.     ?   Behavior: Behavior normal.     ?   Thought Content: Thought content normal.     ?   Judgment: Judgment normal.  ? ? ? ? ? ?   ?Assessment & Plan:  ? ?Muscle strain: I am referring him to sports medicine for more thorough orthopedic and physical examination. ? ?He may benefit from physical therapy.  I would not pursue imaging yet at this point in time ?Take ibuprofen with Tylenol for this ? ? ?Insomnia I have sent in prescription for gabapentin again ? ?HIV disease: ? ?I am checking HIV viral load CD4 count CBC CMP ? ?I will continue his Dovato prescription I also provided him with a 8-day supply of samples since he is renewing H MAP in mid March ? ?Dental pain: Referring to dental clinic here in the clinic ? ?Insomnia: He can take gabapentin at night ? ?I spent 41 minutes with the patient including than 50% of the time in face to face counseling of the patient right shoulder pain insomnia HIV disease along with  review of medical records in preparation for the visit and during the visit and in coordination of his care. ? ? ?

## 2021-06-10 NOTE — Patient Instructions (Signed)
Take two ibuprofens and one tylenol every six hours for your shoulder pain ? ?I am making referral to [facility] ?

## 2021-06-14 LAB — COMPLETE METABOLIC PANEL WITH GFR
AG Ratio: 1.8 (calc) (ref 1.0–2.5)
ALT: 11 U/L (ref 9–46)
AST: 12 U/L (ref 10–40)
Albumin: 4.2 g/dL (ref 3.6–5.1)
Alkaline phosphatase (APISO): 59 U/L (ref 36–130)
BUN: 8 mg/dL (ref 7–25)
CO2: 30 mmol/L (ref 20–32)
Calcium: 9.3 mg/dL (ref 8.6–10.3)
Chloride: 106 mmol/L (ref 98–110)
Creat: 0.99 mg/dL (ref 0.60–1.29)
Globulin: 2.3 g/dL (calc) (ref 1.9–3.7)
Glucose, Bld: 90 mg/dL (ref 65–99)
Potassium: 4.1 mmol/L (ref 3.5–5.3)
Sodium: 143 mmol/L (ref 135–146)
Total Bilirubin: 0.2 mg/dL (ref 0.2–1.2)
Total Protein: 6.5 g/dL (ref 6.1–8.1)
eGFR: 98 mL/min/{1.73_m2} (ref >=60)

## 2021-06-14 LAB — LIPID PANEL
Cholesterol: 165 mg/dL (ref <200)
HDL: 51 mg/dL (ref >=40)
LDL Cholesterol (Calc): 98 mg/dL (calc)
Non-HDL Cholesterol (Calc): 114 mg/dL (calc) (ref <130)
Total CHOL/HDL Ratio: 3.2 (calc) (ref <5.0)
Triglycerides: 75 mg/dL (ref <150)

## 2021-06-14 LAB — CBC WITH DIFFERENTIAL/PLATELET
Absolute Monocytes: 645 cells/uL (ref 200–950)
Basophils Absolute: 31 cells/uL (ref 0–200)
Basophils Relative: 0.6 %
Eosinophils Absolute: 208 cells/uL (ref 15–500)
Eosinophils Relative: 4 %
HCT: 42.3 % (ref 38.5–50.0)
Hemoglobin: 13.9 g/dL (ref 13.2–17.1)
Lymphs Abs: 1690 cells/uL (ref 850–3900)
MCH: 30.5 pg (ref 27.0–33.0)
MCHC: 32.9 g/dL (ref 32.0–36.0)
MCV: 92.8 fL (ref 80.0–100.0)
MPV: 11.1 fL (ref 7.5–12.5)
Monocytes Relative: 12.4 %
Neutro Abs: 2626 cells/uL (ref 1500–7800)
Neutrophils Relative %: 50.5 %
Platelets: 219 10*3/uL (ref 140–400)
RBC: 4.56 10*6/uL (ref 4.20–5.80)
RDW: 12.9 % (ref 11.0–15.0)
Total Lymphocyte: 32.5 %
WBC: 5.2 10*3/uL (ref 3.8–10.8)

## 2021-06-14 LAB — T-HELPER CELLS (CD4) COUNT (NOT AT ARMC)
Absolute CD4: 518 cells/uL (ref 490–1740)
CD4 T Helper %: 26 % — ABNORMAL LOW (ref 30–61)
Total lymphocyte count: 1956 cells/uL (ref 850–3900)

## 2021-06-14 LAB — HIV-1 RNA QUANT-NO REFLEX-BLD
HIV 1 RNA Quant: NOT DETECTED Copies/mL
HIV-1 RNA Quant, Log: NOT DETECTED Log cps/mL

## 2021-06-14 LAB — C. TRACHOMATIS/N. GONORRHOEAE RNA
C. trachomatis RNA, TMA: NOT DETECTED
N. gonorrhoeae RNA, TMA: NOT DETECTED

## 2021-06-14 LAB — RPR: RPR Ser Ql: NONREACTIVE

## 2021-06-18 ENCOUNTER — Other Ambulatory Visit: Payer: Self-pay | Admitting: Pharmacist

## 2021-06-18 DIAGNOSIS — B20 Human immunodeficiency virus [HIV] disease: Secondary | ICD-10-CM

## 2021-06-18 MED ORDER — DOVATO 50-300 MG PO TABS: 1.0000 | ORAL_TABLET | Freq: Every day | ORAL | 0 refills | Status: DC

## 2021-06-18 NOTE — Progress Notes (Signed)
Medication Samples have been provided to the patient. ? ?Drug name: Dovato        ?Strength: 50/300 mg         ?Qty: 28  Tablets (2 bottles) ?LOT: [id number]   ?Exp.Date: 6/24 ? ?Dosing instructions: Take one tablet by mouth once daily ? ?The patient has been instructed regarding the correct time, dose, and frequency of taking this medication, including desired effects and most common side effects.  ? ?Margarite Gouge, PharmD, CPP ?Clinical Pharmacist Practitioner ?Infectious Diseases Clinical Pharmacist ?[facility] ?  ?

## 2021-06-22 ENCOUNTER — Encounter: Payer: Self-pay | Admitting: Family Medicine

## 2021-06-22 ENCOUNTER — Ambulatory Visit: Payer: Self-pay

## 2021-06-22 ENCOUNTER — Ambulatory Visit (INDEPENDENT_AMBULATORY_CARE_PROVIDER_SITE_OTHER): Payer: Self-pay | Admitting: Family Medicine

## 2021-06-22 VITALS — BP 90/58 | Ht 65.0 in | Wt 151.0 lb

## 2021-06-22 DIAGNOSIS — M7918 Myalgia, other site: Secondary | ICD-10-CM

## 2021-06-22 MED ORDER — METHYLPREDNISOLONE ACETATE 40 MG/ML IJ SUSP
40.0000 mg | Freq: Once | INTRAMUSCULAR | Status: AC
  Administered 2021-06-22: 40 mg via INTRA_ARTICULAR

## 2021-06-22 NOTE — Patient Instructions (Signed)
Nice to meet you Please try heat Please try the exercises   Please send me a message in MyChart with any questions or updates.  Please see me back in 4 weeks.   --Dr. Zuriel Roskos  

## 2021-06-22 NOTE — Assessment & Plan Note (Signed)
Acutely occurring.  Does have more of a spasm surrounding the left periscapular region.  May have component of scapular dysfunction but no winging appreciated ?-Counseled on home exercise therapy and supportive care. ?-Trigger point injections today. ?-Could consider physical therapy or further imaging. ?

## 2021-06-22 NOTE — Progress Notes (Signed)
?  Herbert Ellison - 41 y.o. male MRN [id number]  Date of birth: 1980-05-15 ? ?SUBJECTIVE:  Including CC & ROS.  ?No chief complaint on file. ? ? ?Herbert Ellison is a 41 y.o. male that is presenting with upper thoracic and left medial scapular pain.  The pain has been ongoing for a few weeks.  Pain is localized to this area.  No radicular pain.  No injury or inciting event.  No history of surgery.  No improvement with modalities today. ? ?Review of the note from 3/15 shows he was counseled on over-the-counter medications ? ? ?Review of Systems ?See HPI  ? ?HISTORY: Past Medical, Surgical, Social, and Family History Reviewed & Updated per EMR.   ?Pertinent Historical Findings include: ? ?Past Medical History:  ?Diagnosis Date  ? Dental abscess 08/01/2020  ? Dental caries 06/25/2019  ? Depression   ? Hay fever   ? when child  ? HIV disease (HCC)   ? Insomnia 03/06/2021  ? LLQ pain 11/28/2020  ? Myalgia 11/28/2020  ? Pruritic condition 11/28/2020  ? Shoulder pain, left 06/10/2021  ? Transaminitis 11/28/2020  ? ? ?History reviewed. No pertinent surgical history. ? ? ?PHYSICAL EXAM:  ?VS: BP (!) 90/58 (BP Location: Left Arm, Patient Position: Sitting)   Ht 5\' 5"  (1.651 m)   Wt 151 lb (68.5 kg)   BMI 25.13 kg/m?  ?Physical Exam ?Gen: NAD, alert, cooperative with exam, well-appearing ?MSK:  ?Neurovascularly intact   ? ? ?Aspiration/Injection Procedure Note ?Herbert Ellison ?08/18/1980 ? ?Procedure: Injection ?Indications: Left upper thoracic back pain ? ?Procedure Details ?Consent: Risks of procedure as well as the alternatives and risks of each were explained to the (patient/caregiver).  Consent for procedure obtained. ?Time Out: Verified patient identification, verified procedure, site/side was marked, verified correct patient position, special equipment/implants available, medications/allergies/relevent history reviewed, required imaging and test results available.  Performed.  The area was cleaned with  iodine and alcohol swabs.   ? ?The left trapezius, rhomboids and levator scapula was injected using 1 cc's of 40 mg Depo-Medrol and 4 cc's of 0.25% bupivacaine with a 25 1 1/2" needle.  Ultrasound was used. Images were obtained in short views showing the injection.   ? ? ?A sterile dressing was applied. ? ?Patient did tolerate procedure well. ? ? ? ? ?ASSESSMENT & PLAN:  ? ?Myofascial pain syndrome of thoracic spine ?Acutely occurring.  Does have more of a spasm surrounding the left periscapular region.  May have component of scapular dysfunction but no winging appreciated ?-Counseled on home exercise therapy and supportive care. ?-Trigger point injections today. ?-Could consider physical therapy or further imaging. ? ? ? ? ?

## 2021-07-20 ENCOUNTER — Ambulatory Visit: Payer: Self-pay | Admitting: Family Medicine

## 2021-10-23 ENCOUNTER — Ambulatory Visit: Payer: Self-pay | Admitting: Infectious Disease

## 2021-11-12 ENCOUNTER — Ambulatory Visit (INDEPENDENT_AMBULATORY_CARE_PROVIDER_SITE_OTHER): Payer: Self-pay | Admitting: Infectious Disease

## 2021-11-12 ENCOUNTER — Encounter: Payer: Self-pay | Admitting: Infectious Disease

## 2021-11-12 ENCOUNTER — Other Ambulatory Visit: Payer: Self-pay

## 2021-11-12 VITALS — BP 114/73 | HR 78 | Temp 97.7°F | Wt 144.0 lb

## 2021-11-12 DIAGNOSIS — T632X1A Toxic effect of venom of scorpion, accidental (unintentional), initial encounter: Secondary | ICD-10-CM

## 2021-11-12 DIAGNOSIS — M7918 Myalgia, other site: Secondary | ICD-10-CM

## 2021-11-12 DIAGNOSIS — E785 Hyperlipidemia, unspecified: Secondary | ICD-10-CM | POA: Insufficient documentation

## 2021-11-12 DIAGNOSIS — Z113 Encounter for screening for infections with a predominantly sexual mode of transmission: Secondary | ICD-10-CM

## 2021-11-12 DIAGNOSIS — K0889 Other specified disorders of teeth and supporting structures: Secondary | ICD-10-CM

## 2021-11-12 DIAGNOSIS — B20 Human immunodeficiency virus [HIV] disease: Secondary | ICD-10-CM

## 2021-11-12 HISTORY — DX: Hyperlipidemia, unspecified: E78.5

## 2021-11-12 MED ORDER — DOVATO 50-300 MG PO TABS
1.0000 | ORAL_TABLET | Freq: Every day | ORAL | 11 refills | Status: DC
Start: 2021-11-12 — End: 2022-06-21

## 2021-11-12 MED ORDER — GABAPENTIN 300 MG PO CAPS: 300.0000 mg | ORAL_CAPSULE | Freq: Every day | ORAL | 11 refills | Status: DC

## 2021-11-12 MED ORDER — PITAVASTATIN MAGNESIUM 4 MG PO TABS: 4.0000 mg | ORAL_TABLET | Freq: Every day | ORAL | 11 refills | Status: DC
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
## 2021-11-12 NOTE — Addendum Note (Signed)
Addended by: Harley Alto on: 11/12/2021 09:13 AM   Modules accepted: Orders

## 2021-11-13 LAB — T-HELPER CELLS (CD4) COUNT (NOT AT ARMC)
CD4 % Helper T Cell: 25 % — ABNORMAL LOW (ref 33–65)
CD4 T Cell Abs: 318 /uL — ABNORMAL LOW (ref 400–1790)

## 2021-11-13 LAB — CYTOLOGY, (ORAL, ANAL, URETHRAL) ANCILLARY ONLY
Chlamydia: NEGATIVE
Chlamydia: NEGATIVE
Comment: NEGATIVE
Comment: NORMAL
Comment: NEGATIVE
Comment: NORMAL
Neisseria Gonorrhea: NEGATIVE
Neisseria Gonorrhea: NEGATIVE

## 2021-11-13 LAB — URINE CYTOLOGY ANCILLARY ONLY
Chlamydia: NEGATIVE
Comment: NEGATIVE
Comment: NORMAL
Neisseria Gonorrhea: NEGATIVE

## 2021-11-17 LAB — LIPID PANEL
Cholesterol: 198 mg/dL (ref <200)
HDL: 45 mg/dL (ref >=40)
LDL Cholesterol (Calc): 136 mg/dL (calc) — ABNORMAL HIGH
Non-HDL Cholesterol (Calc): 153 mg/dL (calc) — ABNORMAL HIGH (ref <130)
Total CHOL/HDL Ratio: 4.4 (calc) (ref <5.0)
Triglycerides: 77 mg/dL (ref <150)

## 2021-11-17 LAB — CBC WITH DIFFERENTIAL/PLATELET
Absolute Monocytes: 580 cells/uL (ref 200–950)
Basophils Absolute: 43 cells/uL (ref 0–200)
Basophils Relative: 0.7 %
Eosinophils Absolute: 128 cells/uL (ref 15–500)
Eosinophils Relative: 2.1 %
HCT: 42.3 % (ref 38.5–50.0)
Hemoglobin: 13.8 g/dL (ref 13.2–17.1)
Lymphs Abs: 1324 cells/uL (ref 850–3900)
MCH: 30.1 pg (ref 27.0–33.0)
MCHC: 32.6 g/dL (ref 32.0–36.0)
MCV: 92.2 fL (ref 80.0–100.0)
MPV: 10.1 fL (ref 7.5–12.5)
Monocytes Relative: 9.5 %
Neutro Abs: 4026 cells/uL (ref 1500–7800)
Neutrophils Relative %: 66 %
Platelets: 295 10*3/uL (ref 140–400)
RBC: 4.59 10*6/uL (ref 4.20–5.80)
RDW: 13 % (ref 11.0–15.0)
Total Lymphocyte: 21.7 %
WBC: 6.1 10*3/uL (ref 3.8–10.8)

## 2021-11-17 LAB — COMPLETE METABOLIC PANEL WITH GFR
AG Ratio: 1.7 (calc) (ref 1.0–2.5)
ALT: 8 U/L — ABNORMAL LOW (ref 9–46)
AST: 10 U/L (ref 10–40)
Albumin: 4.3 g/dL (ref 3.6–5.1)
Alkaline phosphatase (APISO): 63 U/L (ref 36–130)
BUN: 10 mg/dL (ref 7–25)
CO2: 30 mmol/L (ref 20–32)
Calcium: 9.4 mg/dL (ref 8.6–10.3)
Chloride: 103 mmol/L (ref 98–110)
Creat: 1.03 mg/dL (ref 0.60–1.29)
Globulin: 2.6 g/dL (calc) (ref 1.9–3.7)
Glucose, Bld: 90 mg/dL (ref 65–99)
Potassium: 4.5 mmol/L (ref 3.5–5.3)
Sodium: 139 mmol/L (ref 135–146)
Total Bilirubin: 0.2 mg/dL (ref 0.2–1.2)
Total Protein: 6.9 g/dL (ref 6.1–8.1)
eGFR: 92 mL/min/{1.73_m2} (ref >=60)

## 2021-11-17 LAB — HIV-1 RNA QUANT-NO REFLEX-BLD
HIV 1 RNA Quant: <20 Copies/mL — ABNORMAL HIGH
HIV-1 RNA Quant, Log: <1.3 Log cps/mL — ABNORMAL HIGH

## 2021-11-17 LAB — RPR: RPR Ser Ql: NONREACTIVE

## 2022-03-12 ENCOUNTER — Telehealth: Payer: Self-pay

## 2022-03-12 NOTE — Telephone Encounter (Signed)
Patient called office to request phone to be updated in epic. All other phone number deleted and mobile phone updated as requested.   Valarie Cones, LPN

## 2022-04-14 ENCOUNTER — Ambulatory Visit: Payer: Self-pay | Admitting: Infectious Disease

## 2022-06-21 ENCOUNTER — Other Ambulatory Visit: Payer: Self-pay

## 2022-06-21 ENCOUNTER — Ambulatory Visit (INDEPENDENT_AMBULATORY_CARE_PROVIDER_SITE_OTHER): Payer: Medicaid Other | Admitting: Infectious Disease

## 2022-06-21 ENCOUNTER — Encounter: Payer: Self-pay | Admitting: Infectious Disease

## 2022-06-21 VITALS — BP 125/81 | HR 82 | Temp 97.6°F | Ht 65.0 in | Wt 150.0 lb

## 2022-06-21 DIAGNOSIS — B20 Human immunodeficiency virus [HIV] disease: Secondary | ICD-10-CM | POA: Diagnosis not present

## 2022-06-21 DIAGNOSIS — R519 Headache, unspecified: Secondary | ICD-10-CM | POA: Diagnosis not present

## 2022-06-21 DIAGNOSIS — G8929 Other chronic pain: Secondary | ICD-10-CM

## 2022-06-21 DIAGNOSIS — E785 Hyperlipidemia, unspecified: Secondary | ICD-10-CM

## 2022-06-21 DIAGNOSIS — Z113 Encounter for screening for infections with a predominantly sexual mode of transmission: Secondary | ICD-10-CM

## 2022-06-21 DIAGNOSIS — R42 Dizziness and giddiness: Secondary | ICD-10-CM

## 2022-06-21 HISTORY — DX: Dizziness and giddiness: R42

## 2022-06-21 HISTORY — DX: Headache, unspecified: R51.9

## 2022-06-21 MED ORDER — DOVATO 50-300 MG PO TABS: 1.0000 | ORAL_TABLET | Freq: Every day | ORAL | 11 refills | Status: DC

## 2022-06-21 MED ORDER — ATORVASTATIN CALCIUM 10 MG PO TABS: 10.0000 mg | ORAL_TABLET | Freq: Every day | ORAL | 11 refills | Status: DC

## 2022-06-21 NOTE — Patient Instructions (Signed)
Meet w Beverlee Nims re Medicaid

## 2022-06-21 NOTE — Progress Notes (Signed)
Subjective:  Chief complaints: Except night for last several months.  He also did not tolerate pitavastatin having suffered from some dizziness   Patient ID: Herbert Ellison, male    DOB: June 03, 1980, 42 y.o.   MRN: [id number]  HPI  Herbert Ellison is a 42 year old Herbert Ellison with HIV that has been perfectly controlled most recently on Rowley switch to Joice.  He has been having problems with headaches which do respond somewhat to Tylenol and ibuprofen.  He also has gabapentin prescription but not sure if he is taking that now.  He tried to take the statin but said after taking it for a month he was suffering from dizziness and he stopped it.         Past Medical History:  Diagnosis Date   Dental abscess 08/01/2020   Dental caries 06/25/2019   Depression    Hay fever    when child   HIV disease ([place])    Hyperlipidemia 11/12/2021   Insomnia 03/06/2021   LLQ pain 11/28/2020   Myalgia 11/28/2020   Pruritic condition 11/28/2020   Shoulder pain, left 06/10/2021   Transaminitis 11/28/2020    No past surgical history on file.  Family History  Problem Relation Age of Onset   Congestive Heart Failure Mother        Deceased   Diabetes Mellitus II Maternal Uncle       Social History   Socioeconomic History   Marital status: Single    Spouse name: Not on file   Number of children: Not on file   Years of education: Not on file   Highest education level: Not on file  Occupational History   Not on file  Tobacco Use   Smoking status: Every Day    Packs/day: .5    Types: Cigarettes   Smokeless tobacco: Never   Tobacco comments:    5 ciggs daily  Substance and Sexual Activity   Alcohol use: No    Alcohol/week: 0.0 standard drinks of alcohol   Drug use: Yes    Frequency: 2.0 times per week    Types: Marijuana   Sexual activity: Yes    Partners: Female    Comment: DECLINED CONDOMS  Other Topics Concern   Not on file  Social History Narrative   Not on file   Social  Determinants of Health   Financial Resource Strain: Not on file  Food Insecurity: Not on file  Transportation Needs: Not on file  Physical Activity: Not on file  Stress: Not on file  Social Connections: Not on file    No Known Allergies   Current Outpatient Medications:    dolutegravir-lamiVUDine (DOVATO) 50-300 MG tablet, Take 1 tablet by mouth daily., Disp: 30 tablet, Rfl: 11   gabapentin (NEURONTIN) 300 MG capsule, Take 1 capsule (300 mg total) by mouth at bedtime., Disp: 30 capsule, Rfl: 11   Pitavastatin Magnesium 4 MG TABS, Take 4 mg by mouth daily., Disp: 30 tablet, Rfl: 11   sodium fluoride (SODIUM FLUORIDE 5000 PLUS) 1.1 % CREA dental cream, Use a pea-size amount of toothpaste and brush at evening homecare.  Do not rinse. (Patient not taking: Reported on 10/13/2020), Disp: 51 g, Rfl: 5   Review of Systems  Constitutional:  Negative for activity change, appetite change, chills, diaphoresis, fatigue, fever and unexpected weight change.  HENT:  Negative for congestion, rhinorrhea, sinus pressure, sneezing, sore throat and trouble swallowing.   Eyes:  Negative for photophobia and visual disturbance.  Respiratory:  Negative for cough, chest tightness, shortness of breath, wheezing and stridor.   Cardiovascular:  Negative for chest pain, palpitations and leg swelling.  Gastrointestinal:  Negative for abdominal distention, abdominal pain, anal bleeding, blood in stool, constipation, diarrhea, nausea and vomiting.  Genitourinary:  Negative for difficulty urinating, dysuria, flank pain and hematuria.  Musculoskeletal:  Negative for arthralgias, back pain, gait problem, joint swelling and myalgias.  Skin:  Negative for color change, pallor, rash and wound.  Neurological:  Positive for dizziness and headaches. Negative for tremors, weakness and light-headedness.  Hematological:  Negative for adenopathy. Does not bruise/bleed easily.  Psychiatric/Behavioral:  Negative for agitation,  behavioral problems, confusion, decreased concentration, dysphoric mood and sleep disturbance.        Objective:   Physical Exam Constitutional:      Appearance: He is well-developed.  HENT:     Head: Normocephalic and atraumatic.  Eyes:     Conjunctiva/sclera: Conjunctivae normal.  Cardiovascular:     Rate and Rhythm: Normal rate and regular rhythm.  Pulmonary:     Effort: Pulmonary effort is normal. No respiratory distress.     Breath sounds: No wheezing.  Abdominal:     General: There is no distension.     Palpations: Abdomen is soft.  Musculoskeletal:        General: No tenderness. Normal range of motion.     Cervical back: Normal range of motion and neck supple.  Skin:    General: Skin is warm and dry.     Coloration: Skin is not pale.     Findings: No erythema or rash.  Neurological:     General: No focal deficit present.     Mental Status: He is alert and oriented to person, place, and time.  Psychiatric:        Mood and Affect: Mood normal.        Behavior: Behavior normal.        Thought Content: Thought content normal.        Judgment: Judgment normal.          Assessment & Plan:  HIV disease:  I will add order HIV viral load CD4 count CBC with differential CMP, RPR GC and chlamydia and I will continue  Herbert Ellison's  Dovato, prescription  Will see if he can enroll into Medicaid  CV prevention: will recheallenge with current statin namely atorvastatin  STI screen we will screen for gonorrhea chlamydia and urine as well as checking a syphilis titer.  Headaches: He does not want other medications for this  Dizziness be associated this with being on his pitavastatin.

## 2022-06-22 LAB — URINE CYTOLOGY ANCILLARY ONLY
Chlamydia: NEGATIVE
Comment: NEGATIVE
Comment: NORMAL
Neisseria Gonorrhea: NEGATIVE

## 2022-06-22 LAB — T-HELPER CELLS (CD4) COUNT (NOT AT ARMC)
CD4 % Helper T Cell: 28 % — ABNORMAL LOW (ref 33–65)
CD4 T Cell Abs: 390 /uL — ABNORMAL LOW (ref 400–1790)

## 2022-06-24 LAB — CBC WITH DIFFERENTIAL/PLATELET
Absolute Monocytes: 701 cells/uL (ref 200–950)
Basophils Absolute: 62 cells/uL (ref 0–200)
Basophils Relative: 0.8 %
Eosinophils Absolute: 108 cells/uL (ref 15–500)
Eosinophils Relative: 1.4 %
HCT: 41.7 % (ref 38.5–50.0)
Hemoglobin: 13.7 g/dL (ref 13.2–17.1)
Lymphs Abs: 1671 cells/uL (ref 850–3900)
MCH: 29.7 pg (ref 27.0–33.0)
MCHC: 32.9 g/dL (ref 32.0–36.0)
MCV: 90.3 fL (ref 80.0–100.0)
MPV: 10.2 fL (ref 7.5–12.5)
Monocytes Relative: 9.1 %
Neutro Abs: 5159 cells/uL (ref 1500–7800)
Neutrophils Relative %: 67 %
Platelets: 313 10*3/uL (ref 140–400)
RBC: 4.62 10*6/uL (ref 4.20–5.80)
RDW: 13.8 % (ref 11.0–15.0)
Total Lymphocyte: 21.7 %
WBC: 7.7 10*3/uL (ref 3.8–10.8)

## 2022-06-24 LAB — COMPLETE METABOLIC PANEL WITH GFR
AG Ratio: 1.5 (calc) (ref 1.0–2.5)
ALT: 11 U/L (ref 9–46)
AST: 11 U/L (ref 10–40)
Albumin: 4.3 g/dL (ref 3.6–5.1)
Alkaline phosphatase (APISO): 69 U/L (ref 36–130)
BUN: 10 mg/dL (ref 7–25)
CO2: 29 mmol/L (ref 20–32)
Calcium: 9.6 mg/dL (ref 8.6–10.3)
Chloride: 101 mmol/L (ref 98–110)
Creat: 1.01 mg/dL (ref 0.60–1.29)
Globulin: 2.8 g/dL (calc) (ref 1.9–3.7)
Glucose, Bld: 91 mg/dL (ref 65–99)
Potassium: 4.1 mmol/L (ref 3.5–5.3)
Sodium: 139 mmol/L (ref 135–146)
Total Bilirubin: 0.2 mg/dL (ref 0.2–1.2)
Total Protein: 7.1 g/dL (ref 6.1–8.1)
eGFR: 95 mL/min/{1.73_m2} (ref >=60)

## 2022-06-24 LAB — HIV-1 RNA QUANT-NO REFLEX-BLD
HIV 1 RNA Quant: NOT DETECTED Copies/mL
HIV-1 RNA Quant, Log: NOT DETECTED Log cps/mL

## 2022-06-24 LAB — RPR: RPR Ser Ql: NONREACTIVE

## 2022-07-12 ENCOUNTER — Encounter: Payer: Self-pay | Admitting: *Deleted

## 2022-11-02 ENCOUNTER — Ambulatory Visit: Payer: Medicaid Other | Admitting: Infectious Disease

## 2022-11-24 ENCOUNTER — Encounter: Payer: Self-pay | Admitting: Infectious Disease

## 2022-11-24 DIAGNOSIS — Z7185 Encounter for immunization safety counseling: Secondary | ICD-10-CM | POA: Insufficient documentation

## 2022-11-24 HISTORY — DX: Encounter for immunization safety counseling: Z71.85

## 2022-11-24 NOTE — Progress Notes (Unsigned)
   Subjective:  Chief complaints follow-up for HIV disease on Dovato   Patient ID: Herbert Ellison, male    DOB: [birth year], 42 y.o.   MRN: [id number]  HPI  Stuart is a 42year old Latino man with HIV that has been perfectly controlled most recently on TRIUMEQ switch to Dovato.            Past Medical History:  Diagnosis Date   Dental abscess 08/01/2020   Dental caries 06/25/2019   Depression    Dizziness 06/21/2022   Hay fever    when child   Headache 06/21/2022   HIV disease (HCC)    Hyperlipidemia 11/12/2021   Insomnia 03/06/2021   LLQ pain 11/28/2020   Myalgia 11/28/2020   Pruritic condition 11/28/2020   Shoulder pain, left 06/10/2021   Transaminitis 11/28/2020    No past surgical history on file.  Family History  Problem Relation Age of Onset   Congestive Heart Failure Mother        Deceased   Diabetes Mellitus II Maternal Uncle       Social History   Socioeconomic History   Marital status: Single    Spouse name: Not on file   Number of children: Not on file   Years of education: Not on file   Highest education level: Not on file  Occupational History   Not on file  Tobacco Use   Smoking status: Every Day    Current packs/day: 0.50    Types: Cigarettes   Smokeless tobacco: Never   Tobacco comments:    5 ciggs daily  Substance and Sexual Activity   Alcohol use: No    Alcohol/week: 0.0 standard drinks of alcohol   Drug use: Yes    Frequency: 2.0 times per week    Types: Marijuana   Sexual activity: Yes    Partners: Female    Comment: DECLINED CONDOMS  Other Topics Concern   Not on file  Social History Narrative   Not on file   Social Determinants of Health   Financial Resource Strain: Not on file  Food Insecurity: Not on file  Transportation Needs: Not on file  Physical Activity: Not on file  Stress: Not on file  Social Connections: Not on file    No Known Allergies   Current Outpatient Medications:    atorvastatin  (LIPITOR) 10 MG tablet, Take 1 tablet (10 mg total) by mouth daily., Disp: 30 tablet, Rfl: 11   dolutegravir-lamiVUDine (DOVATO) 50-300 MG tablet, Take 1 tablet by mouth daily., Disp: 30 tablet, Rfl: 11   gabapentin (NEURONTIN) 300 MG capsule, Take 1 capsule (300 mg total) by mouth at bedtime., Disp: 30 capsule, Rfl: 11   sodium fluoride (SODIUM FLUORIDE 5000 PLUS) 1.1 % CREA dental cream, Use a pea-size amount of toothpaste and brush at evening homecare.  Do not rinse. (Patient not taking: Reported on 10/13/2020), Disp: 51 g, Rfl: 5   Review of Systems     Objective:   Physical Exam       Assessment & Plan:    HIV disease:  I will add order HIV viral load CD4 count CBC with differential CMP, RPR GC and chlamydia and I will continue  Merric Cubero-Aldarondo's  Dovato, prescription   Hyperlipidemia  Will continue Lipitor  Neuropathy will continue gabapentin  Vaccine counseling recommended updated flu vaccine as well as updated COVID-vaccine when available and also recommended DTaP

## 2022-11-25 ENCOUNTER — Other Ambulatory Visit: Payer: Self-pay | Admitting: Pharmacist

## 2022-11-25 ENCOUNTER — Encounter: Payer: Self-pay | Admitting: Infectious Disease

## 2022-11-25 ENCOUNTER — Ambulatory Visit: Payer: Medicaid Other | Admitting: Infectious Disease

## 2022-11-25 ENCOUNTER — Other Ambulatory Visit: Payer: Self-pay

## 2022-11-25 ENCOUNTER — Other Ambulatory Visit (HOSPITAL_COMMUNITY)
Admission: RE | Admit: 2022-11-25 | Discharge: 2022-11-25 | Disposition: A | Discharge status: Home / Self Care | Payer: Medicaid Other | Source: Ambulatory Visit | Attending: Infectious Disease | Admitting: Infectious Disease

## 2022-11-25 VITALS — Wt 155.8 lb

## 2022-11-25 DIAGNOSIS — R300 Dysuria: Secondary | ICD-10-CM

## 2022-11-25 DIAGNOSIS — Z7185 Encounter for immunization safety counseling: Secondary | ICD-10-CM

## 2022-11-25 DIAGNOSIS — B20 Human immunodeficiency virus [HIV] disease: Secondary | ICD-10-CM

## 2022-11-25 DIAGNOSIS — R21 Rash and other nonspecific skin eruption: Secondary | ICD-10-CM

## 2022-11-25 DIAGNOSIS — E785 Hyperlipidemia, unspecified: Secondary | ICD-10-CM

## 2022-11-25 DIAGNOSIS — Z23 Encounter for immunization: Secondary | ICD-10-CM

## 2022-11-25 MED ORDER — ATORVASTATIN CALCIUM 10 MG PO TABS: 10.0000 mg | ORAL_TABLET | Freq: Every day | ORAL | 11 refills | Status: DC

## 2022-11-25 MED ORDER — DOVATO 50-300 MG PO TABS
1.0000 | ORAL_TABLET | Freq: Every day | ORAL | Status: AC
Start: 2022-11-25 — End: 2022-12-23

## 2022-11-25 MED ORDER — DOVATO 50-300 MG PO TABS: 1.0000 | ORAL_TABLET | Freq: Every day | ORAL | 11 refills | Status: DC

## 2022-11-25 MED ORDER — TRIAMCINOLONE ACETONIDE 0.5 % EX OINT: 1.0000 | TOPICAL_OINTMENT | Freq: Two times a day (BID) | CUTANEOUS | 2 refills | Status: DC

## 2022-11-25 NOTE — Addendum Note (Signed)
Addended by: Randall Hiss on: 11/25/2022 08:58 AM   Modules accepted: Orders

## 2022-11-25 NOTE — Progress Notes (Signed)
Medication Samples have been provided to the patient.  Drug name: Dovato        Strength: 50/300 mg         Qty: 28  Tablets (2 bottles) LOT: [id number]    Exp.Date: 01/2024  Dosing instructions: Take one tablet by mouth once daily  The patient has been instructed regarding the correct time, dose, and frequency of taking this medication, including desired effects and most common side effects.   Margarite Gouge, PharmD, CPP, BCIDP, AAHIVP Clinical Pharmacist Practitioner Infectious Diseases Clinical Pharmacist [facility]

## 2022-11-26 LAB — T-HELPER CELLS (CD4) COUNT (NOT AT ARMC)
CD4 % Helper T Cell: 24 % — ABNORMAL LOW (ref 33–65)
CD4 T Cell Abs: 405 /uL (ref 400–1790)

## 2022-11-27 LAB — LIPID PANEL
Cholesterol: 155 mg/dL (ref <200)
HDL: 59 mg/dL (ref >=40)
LDL Cholesterol (Calc): 82 mg/dL
Non-HDL Cholesterol (Calc): 96 mg/dL (ref <130)
Total CHOL/HDL Ratio: 2.6 (calc) (ref <5.0)
Triglycerides: 49 mg/dL (ref <150)

## 2022-11-27 LAB — CBC WITH DIFFERENTIAL/PLATELET
Absolute Monocytes: 542 {cells}/uL (ref 200–950)
Basophils Absolute: 38 {cells}/uL (ref 0–200)
Basophils Relative: 0.6 %
Eosinophils Absolute: 151 {cells}/uL (ref 15–500)
Eosinophils Relative: 2.4 %
HCT: 42.6 % (ref 38.5–50.0)
Hemoglobin: 14.1 g/dL (ref 13.2–17.1)
Lymphs Abs: 1871 {cells}/uL (ref 850–3900)
MCH: 30.1 pg (ref 27.0–33.0)
MCHC: 33.1 g/dL (ref 32.0–36.0)
MCV: 91 fL (ref 80.0–100.0)
MPV: 10.3 fL (ref 7.5–12.5)
Monocytes Relative: 8.6 %
Neutro Abs: 3698 {cells}/uL (ref 1500–7800)
Neutrophils Relative %: 58.7 %
Platelets: 262 10*3/uL (ref 140–400)
RBC: 4.68 10*6/uL (ref 4.20–5.80)
RDW: 13.7 % (ref 11.0–15.0)
Total Lymphocyte: 29.7 %
WBC: 6.3 10*3/uL (ref 3.8–10.8)

## 2022-11-27 LAB — COMPLETE METABOLIC PANEL WITH GFR
AG Ratio: 1.5 (calc) (ref 1.0–2.5)
ALT: 14 U/L (ref 9–46)
AST: 11 U/L (ref 10–40)
Albumin: 4.4 g/dL (ref 3.6–5.1)
Alkaline phosphatase (APISO): 67 U/L (ref 36–130)
BUN: 12 mg/dL (ref 7–25)
CO2: 27 mmol/L (ref 20–32)
Calcium: 9.6 mg/dL (ref 8.6–10.3)
Chloride: 105 mmol/L (ref 98–110)
Creat: 1.01 mg/dL (ref 0.60–1.29)
Globulin: 2.9 g/dL (ref 1.9–3.7)
Glucose, Bld: 86 mg/dL (ref 65–99)
Potassium: 4.3 mmol/L (ref 3.5–5.3)
Sodium: 140 mmol/L (ref 135–146)
Total Bilirubin: 0.4 mg/dL (ref 0.2–1.2)
Total Protein: 7.3 g/dL (ref 6.1–8.1)
eGFR: 94 mL/min/{1.73_m2} (ref >=60)

## 2022-11-27 LAB — RPR: RPR Ser Ql: NONREACTIVE

## 2022-11-27 LAB — HEPATITIS C AB W/RFL RNA, PCR + GENO: Hepatitis C Ab: NONREACTIVE

## 2022-11-27 LAB — HIV-1 RNA QUANT-NO REFLEX-BLD
HIV 1 RNA Quant: NOT DETECTED {copies}/mL
HIV-1 RNA Quant, Log: NOT DETECTED {Log_copies}/mL

## 2022-12-02 ENCOUNTER — Other Ambulatory Visit: Payer: Self-pay | Admitting: Infectious Disease

## 2022-12-02 DIAGNOSIS — R85610 Atypical squamous cells of undetermined significance on cytologic smear of anus (ASC-US): Secondary | ICD-10-CM

## 2022-12-02 LAB — CYTOLOGY - PAP
Comment: NEGATIVE
Diagnosis: UNDETERMINED — AB
High risk HPV: NEGATIVE

## 2022-12-02 LAB — URINE CYTOLOGY ANCILLARY ONLY
Chlamydia: NEGATIVE
Comment: NEGATIVE
Comment: NORMAL
Neisseria Gonorrhea: NEGATIVE

## 2022-12-02 LAB — CYTOLOGY, (ORAL, ANAL, URETHRAL) ANCILLARY ONLY
Chlamydia: NEGATIVE
Chlamydia: NEGATIVE
Comment: NEGATIVE
Comment: NEGATIVE
Comment: NORMAL
Comment: NORMAL
Neisseria Gonorrhea: NEGATIVE
Neisseria Gonorrhea: NEGATIVE

## 2022-12-03 ENCOUNTER — Telehealth: Payer: Self-pay

## 2022-12-03 NOTE — Telephone Encounter (Signed)
Patient informed of results and advised of referral. Patient verbalized understanding Herbert Ellison

## 2022-12-03 NOTE — Telephone Encounter (Signed)
-----   Message from Paulette Blanch Dam sent at 12/02/2022  2:29 PM EDT ----- Pt w aSCUS and referral to [facility] placed ----- Message ----- From: Janace Hoard Lab Results In Sent: 11/25/2022  10:54 PM EDT To: Randall Hiss, MD

## 2022-12-03 NOTE — Telephone Encounter (Signed)
Called [facility] to relay results, no answer. Interpreter left message requesting call back.   [employer] Mikayla [phone]  Sandie Ano, RN

## 2023-01-18 ENCOUNTER — Ambulatory Visit: Payer: Self-pay | Admitting: General Surgery

## 2023-01-18 NOTE — H&P (Signed)
REFERRING PHYSICIAN:  Dr Daiva Eves  PROVIDER:  Elenora Gamma, MD  MRN: [id number] DOB: 1980-07-30 DATE OF ENCOUNTER: 01/18/2023  Subjective   History of Present Illness: Herbert Ellison is a 42 y.o. male who is seen today as an office consultation at the request of Dr Daiva Eves for evaluation of No chief complaint on file. .  Patient recently had an anal Pap test.  This showed ASCUS.  High risk HPV was negative.  He is here today for further discussion on follow-up testing.   Review of Systems: A complete review of systems was obtained from the patient.  I have reviewed this information and discussed as appropriate with the patient.  See HPI as well for other ROS.   Medical History: Past Medical History: Diagnosis Date  Anxiety   HIV -AIDS with opportunistic infection, Symptomatic (CMS/HHS-HCC)   Substance abuse (CMS/HHS-HCC)    There is no problem list on file for this patient.   History reviewed. No pertinent surgical history.   No Known Allergies  Current Outpatient Medications on File Prior to Visit Medication Sig Dispense Refill  DOVATO 50-300 mg Tab Take 1 tablet by mouth once daily    No current facility-administered medications on file prior to visit.   Family History Problem Relation Age of Onset  Hyperlipidemia (Elevated cholesterol) Mother   Coronary Artery Disease (Blocked arteries around heart) Mother   Diabetes Mother     Social History  Tobacco Use Smoking Status [name]  Types: Cigarettes Smokeless Tobacco Never    Social History  Socioeconomic History  Marital status: Married Tobacco Use  Smoking status: [name]   Types: Cigarettes  Smokeless tobacco: Never Substance and Sexual Activity  Alcohol use: Never  Drug use: Yes   Types: "Crack" cocaine, Marijuana   Objective:   Vitals:  01/18/23 0949 BP: 120/80 Pulse: 83 Temp: 36.9 C (98.5 F) SpO2: 97% Weight: 73.8 kg (162 lb 9.6 oz) Height: 165.1 cm (5'  5") PainSc: 0-No pain    Exam Gen: NAD CV: RRR Pulm: CTA Abd: soft  Labs, Imaging and Diagnostic Testing: CD4: 405 HIV: ND  Assessment and Plan: Diagnoses and all orders for this visit:  Pap smear of anus with ASCUS  42 year old male with well-controlled HIV who presents to the office for evaluation of ASCUS found on recent anal Pap test.  We discussed proceeding with high-resolution anoscopy with possible biopsy or ablation.  We discussed this in detail including postoperative pain and bleeding that can be associated with treatment.  All questions were answered.  Patient would like to proceed.  Vanita Panda, MD Colon and Rectal Surgery [facility]

## 2023-03-04 ENCOUNTER — Encounter (HOSPITAL_BASED_OUTPATIENT_CLINIC_OR_DEPARTMENT_OTHER): Payer: Self-pay | Admitting: General Surgery

## 2023-03-04 ENCOUNTER — Telehealth: Payer: Self-pay

## 2023-03-04 NOTE — Telephone Encounter (Signed)
Received call today from Pre op nurse regarding patient upcoming appt on 12/13 with Dr. Maisie Fus. States that she received voicemail from office on behalf of patient and wanted to follow up. Spoke with Karie Kirks at office who states patient had questions if he need to take any pre op before procedure and if so where he would need to pick it up. Per RN no pre op is required. Will call patient later today to review all this with patient.  Informed patient that pre op nurse would call him today. Juanita Laster, RMA

## 2023-03-09 ENCOUNTER — Encounter (HOSPITAL_BASED_OUTPATIENT_CLINIC_OR_DEPARTMENT_OTHER): Payer: Self-pay | Admitting: Certified Registered Nurse Anesthetist

## 2023-03-09 ENCOUNTER — Encounter (HOSPITAL_BASED_OUTPATIENT_CLINIC_OR_DEPARTMENT_OTHER): Payer: Self-pay | Admitting: General Surgery

## 2023-03-09 NOTE — Progress Notes (Addendum)
Spoke w/ via phone for pre-op interview--- pt thru spanish pacific interpreter ID # [phone] Lab needs dos----   urine drug screen      Lab results------ no COVID test -----patient states asymptomatic no test needed Arrive at ------- 0530 on 03-11-2023 NPO after MN  Med rec completed Medications to take morning of surgery ----- dovato w/ sip of water Diabetic medication ----- n/a Patient instructed no nail polish to be worn day of surgery Patient instructed to bring photo id and insurance card day of surgery Patient aware to have Driver (ride ) / caregiver for 24 hours after surgery - Aunt ,  Herbert Ellison Pt verbalized understanding thru interpreter to have aunt's phone number when check in day of surgery since he will be dropped off by uber Patient was planning to take uber to and from facility , stated he was not aware needed responsible driver or caregiver.  Reviewed reasons for driver and caregiver due to anesthesia ,  pt verbalized understanding. Patient Special Instructions -----  n/a Pre-Op special Instructions -----  pt requested spanish interpreter dos , no gender preference.  Sent request via email to [employer] interpreting . Patient verbalized understanding of instructions that were given at this phone interview. Patient denies chest pain, sob, fever, cough at the interview.

## 2023-03-10 NOTE — Anesthesia Preprocedure Evaluation (Addendum)
Anesthesia Evaluation    Reviewed: Allergy & Precautions, Patient's Chart, lab work & pertinent test results  History of Anesthesia Complications Negative for: history of anesthetic complications  Airway        Dental   Pulmonary Current Smoker and Patient abstained from smoking.          Cardiovascular negative cardio ROS      Neuro/Psych  Headaches PSYCHIATRIC DISORDERS  Depression       GI/Hepatic negative GI ROS,,,(+)     substance abuse  cocaine use and marijuana use  Endo/Other  negative endocrine ROS    Renal/GU negative Renal ROS     Musculoskeletal negative musculoskeletal ROS (+)    Abdominal   Peds  Hematology  (+) HIV  Anesthesia Other Findings   Reproductive/Obstetrics                             Anesthesia Physical Anesthesia Plan  ASA: 3  Anesthesia Plan: MAC   Post-op Pain Management: Tylenol PO (pre-op)* and Minimal or no pain anticipated   Induction:   PONV Risk Score and Plan: 0 and Propofol infusion and Treatment may vary due to age or medical condition  Airway Management Planned: Natural Airway and Simple Face Mask  Additional Equipment: None  Intra-op Plan:   Post-operative Plan:   Informed Consent:   Plan Discussed with: CRNA and Anesthesiologist  Anesthesia Plan Comments: (Patient denies any drug use besides marijuana. +cocaine on urine screen, patient still denying use.)       Anesthesia Quick Evaluation

## 2023-03-11 ENCOUNTER — Encounter (HOSPITAL_BASED_OUTPATIENT_CLINIC_OR_DEPARTMENT_OTHER): Payer: Self-pay | Admitting: General Surgery

## 2023-03-11 ENCOUNTER — Ambulatory Visit (HOSPITAL_BASED_OUTPATIENT_CLINIC_OR_DEPARTMENT_OTHER)
Admission: RE | Admit: 2023-03-11 | Discharge: 2023-03-11 | Disposition: A | Discharge status: Home / Self Care | Payer: Medicaid Other | Attending: General Surgery | Admitting: General Surgery

## 2023-03-11 ENCOUNTER — Other Ambulatory Visit: Payer: Self-pay

## 2023-03-11 ENCOUNTER — Encounter (HOSPITAL_BASED_OUTPATIENT_CLINIC_OR_DEPARTMENT_OTHER)
Admission: RE | Disposition: A | Discharge status: Home / Self Care | Payer: Self-pay | Source: Home / Self Care | Attending: General Surgery

## 2023-03-11 DIAGNOSIS — R85610 Atypical squamous cells of undetermined significance on cytologic smear of anus (ASC-US): Secondary | ICD-10-CM | POA: Insufficient documentation

## 2023-03-11 DIAGNOSIS — Z01818 Encounter for other preprocedural examination: Secondary | ICD-10-CM

## 2023-03-11 DIAGNOSIS — Z5309 Procedure and treatment not carried out because of other contraindication: Secondary | ICD-10-CM | POA: Insufficient documentation

## 2023-03-11 HISTORY — DX: Unspecified abnormal cytological findings in specimens from anus: R85.619

## 2023-03-11 HISTORY — DX: Other psychoactive substance abuse, uncomplicated: F19.10

## 2023-03-11 LAB — RAPID URINE DRUG SCREEN, HOSP PERFORMED
Amphetamines: NOT DETECTED
Barbiturates: NOT DETECTED
Benzodiazepines: NOT DETECTED
Cocaine: POSITIVE — AB
Opiates: NOT DETECTED
Tetrahydrocannabinol: POSITIVE — AB

## 2023-03-11 SURGERY — HIGH RESOLUTION ANOSCOPY
Anesthesia: Monitor Anesthesia Care

## 2023-03-11 MED ORDER — KETOROLAC TROMETHAMINE 30 MG/ML IJ SOLN
INTRAMUSCULAR | Status: AC
Start: 2023-03-11 — End: ?
  Filled 2023-03-11: qty 1

## 2023-03-11 MED ORDER — DEXAMETHASONE SODIUM PHOSPHATE 10 MG/ML IJ SOLN
INTRAMUSCULAR | Status: AC
Start: 2023-03-11 — End: ?
  Filled 2023-03-11: qty 1

## 2023-03-11 MED ORDER — ONDANSETRON HCL 4 MG/2ML IJ SOLN
INTRAMUSCULAR | Status: AC
  Filled 2023-03-11: qty 2

## 2023-03-11 MED ORDER — FENTANYL CITRATE (PF) 100 MCG/2ML IJ SOLN
INTRAMUSCULAR | Status: AC
  Filled 2023-03-11: qty 2

## 2023-03-11 MED ORDER — PROPOFOL 1000 MG/100ML IV EMUL
INTRAVENOUS | Status: AC
  Filled 2023-03-11: qty 100

## 2023-03-11 MED ORDER — SODIUM CHLORIDE 0.9 % IV SOLN: INTRAVENOUS | Status: DC

## 2023-03-11 MED ORDER — LACTATED RINGERS IV SOLN: INTRAVENOUS | Status: DC

## 2023-03-11 MED ORDER — ACETAMINOPHEN 500 MG PO TABS
ORAL_TABLET | ORAL | Status: AC
  Filled 2023-03-11: qty 2

## 2023-03-11 MED ORDER — ACETAMINOPHEN 500 MG PO TABS: 1000.0000 mg | ORAL_TABLET | Freq: Once | ORAL | Status: DC

## 2023-03-11 MED ORDER — MIDAZOLAM HCL 2 MG/2ML IJ SOLN
INTRAMUSCULAR | Status: AC
  Filled 2023-03-11: qty 2

## 2023-03-11 MED ORDER — ACETAMINOPHEN 500 MG PO TABS: 1000.0000 mg | ORAL_TABLET | ORAL | Status: DC

## 2023-03-11 MED ORDER — LIDOCAINE HCL (PF) 2 % IJ SOLN
INTRAMUSCULAR | Status: AC
Start: 2023-03-11 — End: ?
  Filled 2023-03-11: qty 5

## 2023-03-11 SURGICAL SUPPLY — 48 items
APPLICATOR COTTON TIP 6IN STRL (MISCELLANEOUS)
BENZOIN TINCTURE PRP APPL 2/3 (GAUZE/BANDAGES/DRESSINGS)
BLADE EXTENDED COATED 6.5IN (ELECTRODE)
BLADE SURG 10 STRL SS (BLADE)
BRIEF MESH DISP LRG (UNDERPADS AND DIAPERS)
COVER BACK TABLE 60X90IN (DRAPES)
COVER MAYO STAND STRL (DRAPES)
DRAPE HYSTEROSCOPY (MISCELLANEOUS)
DRAPE LAPAROTOMY 100X72 PEDS (DRAPES)
DRAPE SHEET LG 3/4 BI-LAMINATE (DRAPES)
DRAPE UTILITY XL STRL (DRAPES)
ELECT REM PT RETURN 9FT ADLT (ELECTROSURGICAL)
GAUZE 4X4 16PLY ~~LOC~~+RFID DBL (SPONGE)
GAUZE PAD ABD 8X10 STRL (GAUZE/BANDAGES/DRESSINGS)
GAUZE SPONGE 4X4 12PLY STRL (GAUZE/BANDAGES/DRESSINGS)
GLOVE BIO SURGEON STRL SZ 6.5 (GLOVE)
GLOVE INDICATOR 6.5 STRL GRN (GLOVE)
GOWN STRL REUS W/TWL XL LVL3 (GOWN DISPOSABLE)
HYDROGEN PEROXIDE 16OZ (MISCELLANEOUS)
IV CATH 14GX2 1/4 (CATHETERS)
IV CATH 18G SAFETY (IV SOLUTION)
KIT SIGMOIDOSCOPE (SET/KITS/TRAYS/PACK)
KIT TURNOVER CYSTO (KITS)
LEGGING LITHOTOMY PAIR STRL (DRAPES)
NEEDLE HYPO 22X1.5 SAFETY MO (MISCELLANEOUS)
NS IRRIG 500ML POUR BTL (IV SOLUTION)
PACK BASIN DAY SURGERY FS (CUSTOM PROCEDURE TRAY)
PAD ARMBOARD 7.5X6 YLW CONV (MISCELLANEOUS)
PENCIL SMOKE EVACUATOR (MISCELLANEOUS)
SLEEVE SCD COMPRESS KNEE MED (STOCKING)
SPIKE FLUID TRANSFER (MISCELLANEOUS)
SPONGE HEMORRHOID 8X3CM (HEMOSTASIS)
SPONGE SURGIFOAM ABS GEL 12-7 (HEMOSTASIS)
SUCTION TUBE FRAZIER 10FR DISP (SUCTIONS)
SUT CHROMIC 2 0 SH (SUTURE)
SUT CHROMIC 3 0 SH 27 (SUTURE)
SUT ETHIBOND 0 (SUTURE)
SUT VIC AB 2-0 SH 27XBRD (SUTURE)
SUT VIC AB 3-0 SH 18 (SUTURE)
SUT VIC AB 3-0 SH 27XBRD (SUTURE)
SYR BULB IRRIG 60ML STRL (SYRINGE)
SYR CONTROL 10ML LL (SYRINGE)
TOWEL OR 17X24 6PK STRL BLUE (TOWEL DISPOSABLE)
TRAY DSU PREP LF (CUSTOM PROCEDURE TRAY)
TUBE CONNECTING 12X1/4 (SUCTIONS)
VASCULAR TIE MAXI BLUE 18IN ST (MISCELLANEOUS)
WATER STERILE IRR 500ML POUR (IV SOLUTION)
YANKAUER SUCT BULB TIP NO VENT (SUCTIONS)

## 2023-03-11 NOTE — Progress Notes (Addendum)
Dr. Mal Amabile, MDA aware drug screen positive for THC and Cocaine. Per pt has used marijuana yesterday, cocaine has been "a long time". Per MDA would like to speak with surgeon  [phone]: Dr. Maisie Fus aware, plan to cancel procedure.

## 2023-04-12 ENCOUNTER — Encounter: Payer: Self-pay | Admitting: Infectious Disease

## 2023-04-12 DIAGNOSIS — R825 Elevated urine levels of drugs, medicaments and biological substances: Secondary | ICD-10-CM | POA: Insufficient documentation

## 2023-04-12 HISTORY — DX: Elevated urine levels of drugs, medicaments and biological substances: R82.5

## 2023-04-12 NOTE — Progress Notes (Signed)
 Subjective:  Chief complaint: follow-up for HIV disease on medications, also with left-sided shoulder pain for a month   Patient ID: Herbert Ellison, male    DOB: 14-Jul-1980, 43 y.o.   MRN: [id number]  HPI  Discussed the use of AI scribe software for clinical note transcription with the patient, who gave verbal consent to proceed.  History of Present Illness   The patient presents with shoulder pain that has been ongoing for a month. The pain is localized to the bone and is exacerbated by movement and touch. The patient also reports needing to renew his Medicaid, which is due to expire on the 22nd of the current month. He has not yet received his pneumonia and flu vaccines, nor his COVID-19 shot  The patient is willing to receive all three vaccines.  The patient also reports a previous issue with a sports medicine clinic, where he was charged for services --this was when he was on RW but not Medicaid. He is hesitant to return to the clinic due to this experience.  The patient is scheduled for an anoscopy on January 29th, which was previously postponed due to a positive cocaine test. The patient denies cocaine use and attributes the positive result to contact with others who use the drug. He admits to using marijuana for pain management.  The patient also reports a resolved rash on his neck and is currently on Dovato  for HIV. He is also on Lipitor and was previously on gabapentin  for sleep issues, but has since stopped taking it and is managing his sleep with tea. The patient is planning to move to [place]  after his upcoming anoscopy.       Past Medical History:  Diagnosis Date   Abnormal anal Papanicolaou smear    Dental caries 06/25/2019   Depression    Dizziness 06/21/2022   Headache 06/21/2022   HIV disease (HCC)    Hyperlipidemia 11/12/2021   Insomnia 03/06/2021   Myalgia 11/28/2020   Polysubstance abuse (HCC)    marijuana/  cocaine/ "crack"   Vaccine counseling  11/24/2022    Past Surgical History:  Procedure Laterality Date   NO PAST SURGERIES      Family History  Problem Relation Age of Onset   Congestive Heart Failure Mother        Deceased   Diabetes Mellitus II Maternal Uncle       Social History   Socioeconomic History   Marital status: Single    Spouse name: Not on file   Number of children: Not on file   Years of education: Not on file   Highest education level: Not on file  Occupational History   Not on file  Tobacco Use   Smoking status: Every Day    Current packs/day: 0.50    Types: Cigarettes   Smokeless tobacco: Never   Tobacco comments:    03-09-2023  per pt smokes 1/2 ppd,  started age 6  Vaping Use   Vaping status: Never Used  Substance and Sexual Activity   Alcohol use: Not Currently    Comment: 03-09-2023 last alcohol 4-5 months ago   Drug use: Yes    Types: Marijuana, Cocaine, "Crack" cocaine    Comment: 03-09-2023  per pt smokes marijuana at nightly  for bodyaches;  last cocaine one month ago/ "crack" last week   Sexual activity: Yes    Partners: Female    Comment: DECLINED CONDOMS  Other Topics Concern   Not on file  Social History Narrative  Not on file   Social Drivers of Health   Financial Resource Strain: Not on file  Food Insecurity: Not on file  Transportation Needs: Not on file  Physical Activity: Not on file  Stress: Not on file  Social Connections: Not on file    No Known Allergies   Current Outpatient Medications:    acetaminophen  (TYLENOL ) 500 MG tablet, Take 500 mg by mouth every 6 (six) hours as needed for headache., Disp: , Rfl:    atorvastatin  (LIPITOR) 10 MG tablet, Take 1 tablet (10 mg total) by mouth daily., Disp: 30 tablet, Rfl: 11   dolutegravir-lamiVUDine (DOVATO ) 50-300 MG tablet, Take 1 tablet by mouth daily. (Patient taking differently: Take 1 tablet by mouth daily.), Disp: 30 tablet, Rfl: 11   gabapentin  (NEURONTIN ) 300 MG capsule, Take 1 capsule (300 mg total)  by mouth at bedtime., Disp: 30 capsule, Rfl: 11   sodium fluoride  (SODIUM FLUORIDE  5000 PLUS) 1.1 % CREA dental cream, Use a pea-size amount of toothpaste and brush at evening homecare.  Do not rinse. (Patient not taking: Reported on 10/13/2020), Disp: 51 g, Rfl: 5   triamcinolone  ointment (KENALOG ) 0.5 %, Apply 1 Application topically 2 (two) times daily. To the neck (in Spanish) (Patient not taking: Reported on 03/09/2023), Disp: 30 g, Rfl: 2   Review of Systems  Constitutional:  Negative for activity change, appetite change, chills, diaphoresis, fatigue, fever and unexpected weight change.  HENT:  Negative for congestion, rhinorrhea, sinus pressure, sneezing, sore throat and trouble swallowing.   Eyes:  Negative for photophobia and visual disturbance.  Respiratory:  Negative for cough, chest tightness, shortness of breath, wheezing and stridor.   Cardiovascular:  Negative for chest pain, palpitations and leg swelling.  Gastrointestinal:  Negative for abdominal distention, abdominal pain, anal bleeding, blood in stool, constipation, diarrhea, nausea and vomiting.  Genitourinary:  Negative for difficulty urinating, dysuria, flank pain and hematuria.  Musculoskeletal:  Positive for arthralgias. Negative for back pain, gait problem, joint swelling and myalgias.  Skin:  Negative for color change, pallor, rash and wound.  Neurological:  Negative for dizziness, tremors, weakness and light-headedness.  Hematological:  Negative for adenopathy. Does not bruise/bleed easily.  Psychiatric/Behavioral:  Negative for agitation, behavioral problems, confusion, decreased concentration, dysphoric mood and sleep disturbance.        Objective:   Physical Exam Constitutional:      Appearance: He is well-developed.  HENT:     Head: Normocephalic and atraumatic.  Eyes:     Conjunctiva/sclera: Conjunctivae normal.  Cardiovascular:     Rate and Rhythm: Normal rate and regular rhythm.  Pulmonary:     Effort:  Pulmonary effort is normal. No respiratory distress.     Breath sounds: No wheezing.  Abdominal:     General: There is no distension.     Palpations: Abdomen is soft.  Musculoskeletal:        General: No tenderness. Normal range of motion.     Left shoulder: Bony tenderness present. No swelling, deformity or effusion. Normal range of motion.     Cervical back: Normal range of motion and neck supple.  Skin:    General: Skin is warm and dry.     Coloration: Skin is not pale.     Findings: No erythema or rash.  Neurological:     General: No focal deficit present.     Mental Status: He is alert and oriented to person, place, and time.  Psychiatric:        Mood  and Affect: Mood normal.        Behavior: Behavior normal.        Thought Content: Thought content normal.        Judgment: Judgment normal.           Assessment & Plan:   Assessment and Plan    Shoulder Pain Pain for one month, exacerbated by movement and touch. Discussed the option of an x-ray and referral to sports medicine or orthopedics. -Order shoulder x-ray. -Referral to sports medicine or orthopedics, ensuring Medicaid coverage.  HIV On Dovato , with plans to check viral load and CD4. -Continue Dovato . -Order labs for viral load and CD4.  Sexually Transmitted Infections Discussed testing for gonorrhea, chlamydia, and other STIs. -Order STI testing (oral, urine, and rectal).  Insomnia Previously on gabapentin , currently managing with tea. -Monitor and reassess if sleep issues persist.  Hyperlipidemia On Lipitor. -Continue Lipitor --check lipid panel  Vaccinations Discussed flu, COVID, and pneumonia vaccines. -Administer flu, COVID, and pneumonia vaccines.  General Health Maintenance / Followup Plans -Plan for anoscopy on 04/27/2023. -Follow-up in 4-6 months or as needed before potential move to [place] . -Ensure adequate medication supply before move.

## 2023-04-13 ENCOUNTER — Other Ambulatory Visit: Payer: Self-pay

## 2023-04-13 ENCOUNTER — Other Ambulatory Visit (HOSPITAL_COMMUNITY)
Admission: RE | Admit: 2023-04-13 | Discharge: 2023-04-13 | Disposition: A | Discharge status: Home / Self Care | Payer: Medicaid Other | Source: Ambulatory Visit | Attending: Infectious Disease | Admitting: Infectious Disease

## 2023-04-13 ENCOUNTER — Encounter: Payer: Self-pay | Admitting: Infectious Disease

## 2023-04-13 ENCOUNTER — Ambulatory Visit: Payer: Medicaid Other | Admitting: Infectious Disease

## 2023-04-13 VITALS — BP 110/79 | HR 85 | Temp 97.3°F | Ht 66.0 in | Wt 166.0 lb

## 2023-04-13 DIAGNOSIS — R21 Rash and other nonspecific skin eruption: Secondary | ICD-10-CM

## 2023-04-13 DIAGNOSIS — Z23 Encounter for immunization: Secondary | ICD-10-CM

## 2023-04-13 DIAGNOSIS — R825 Elevated urine levels of drugs, medicaments and biological substances: Secondary | ICD-10-CM

## 2023-04-13 DIAGNOSIS — R85610 Atypical squamous cells of undetermined significance on cytologic smear of anus (ASC-US): Secondary | ICD-10-CM

## 2023-04-13 DIAGNOSIS — E785 Hyperlipidemia, unspecified: Secondary | ICD-10-CM | POA: Diagnosis not present

## 2023-04-13 DIAGNOSIS — Z7185 Encounter for immunization safety counseling: Secondary | ICD-10-CM

## 2023-04-13 DIAGNOSIS — B20 Human immunodeficiency virus [HIV] disease: Secondary | ICD-10-CM | POA: Diagnosis present

## 2023-04-13 DIAGNOSIS — G8929 Other chronic pain: Secondary | ICD-10-CM

## 2023-04-13 HISTORY — DX: Atypical squamous cells of undetermined significance on cytologic smear of anus (ASC-US): R85.610

## 2023-04-13 MED ORDER — ATORVASTATIN CALCIUM 10 MG PO TABS: 10.0000 mg | ORAL_TABLET | Freq: Every day | ORAL | 11 refills | Status: DC

## 2023-04-13 MED ORDER — DOVATO 50-300 MG PO TABS: 1.0000 | ORAL_TABLET | Freq: Every day | ORAL | 11 refills | Status: DC

## 2023-04-13 NOTE — Addendum Note (Signed)
 Addended by: Gypsy Lesser on: 04/13/2023 09:43 AM   Modules accepted: Orders

## 2023-04-13 NOTE — Addendum Note (Signed)
 Addended by: Rawleigh Cadet T on: 04/13/2023 10:32 AM   Modules accepted: Orders

## 2023-04-14 LAB — CYTOLOGY, (ORAL, ANAL, URETHRAL) ANCILLARY ONLY
Chlamydia: NEGATIVE
Chlamydia: NEGATIVE
Comment: NEGATIVE
Comment: NEGATIVE
Comment: NORMAL
Comment: NORMAL
Neisseria Gonorrhea: NEGATIVE
Neisseria Gonorrhea: NEGATIVE

## 2023-04-14 LAB — URINE CYTOLOGY ANCILLARY ONLY
Chlamydia: NEGATIVE
Comment: NEGATIVE
Comment: NORMAL
Neisseria Gonorrhea: NEGATIVE

## 2023-04-14 LAB — T-HELPER CELLS (CD4) COUNT (NOT AT ARMC)
Absolute CD4: 404 {cells}/uL — ABNORMAL LOW (ref 490–1740)
CD4 T Helper %: 24 % — ABNORMAL LOW (ref 30–61)
Total lymphocyte count: 1679 {cells}/uL (ref 850–3900)

## 2023-04-16 LAB — CBC WITH DIFFERENTIAL/PLATELET
Absolute Lymphocytes: 1671 {cells}/uL (ref 850–3900)
Absolute Monocytes: 519 {cells}/uL (ref 200–950)
Basophils Absolute: 49 {cells}/uL (ref 0–200)
Basophils Relative: 0.8 %
Eosinophils Absolute: 146 {cells}/uL (ref 15–500)
Eosinophils Relative: 2.4 %
HCT: 40.8 % (ref 38.5–50.0)
Hemoglobin: 13.1 g/dL — ABNORMAL LOW (ref 13.2–17.1)
MCH: 30 pg (ref 27.0–33.0)
MCHC: 32.1 g/dL (ref 32.0–36.0)
MCV: 93.4 fL (ref 80.0–100.0)
MPV: 10.1 fL (ref 7.5–12.5)
Monocytes Relative: 8.5 %
Neutro Abs: 3715 {cells}/uL (ref 1500–7800)
Neutrophils Relative %: 60.9 %
Platelets: 284 10*3/uL (ref 140–400)
RBC: 4.37 10*6/uL (ref 4.20–5.80)
RDW: 13.1 % (ref 11.0–15.0)
Total Lymphocyte: 27.4 %
WBC: 6.1 10*3/uL (ref 3.8–10.8)

## 2023-04-16 LAB — DRUG MONITOR, PANEL 1, W/CONF, URINE
Amphetamines: NEGATIVE ng/mL (ref <500)
Barbiturates: NEGATIVE ng/mL (ref <300)
Benzodiazepines: NEGATIVE ng/mL (ref <100)
Cocaine Metabolite: NEGATIVE ng/mL (ref <150)
Creatinine: 115.9 mg/dL (ref >=20.0)
Marijuana Metabolite: 159 ng/mL — ABNORMAL HIGH (ref <5)
Marijuana Metabolite: POSITIVE ng/mL — AB (ref <20)
Methadone Metabolite: NEGATIVE ng/mL (ref <100)
Opiates: NEGATIVE ng/mL (ref <100)
Oxidant: NEGATIVE ug/mL (ref <200)
Oxycodone: NEGATIVE ng/mL (ref <100)
Phencyclidine: NEGATIVE ng/mL (ref <25)
pH: 5.6 (ref 4.5–9.0)

## 2023-04-16 LAB — COMPLETE METABOLIC PANEL WITH GFR
AG Ratio: 1.5 (calc) (ref 1.0–2.5)
ALT: 15 U/L (ref 9–46)
AST: 11 U/L (ref 10–40)
Albumin: 4.3 g/dL (ref 3.6–5.1)
Alkaline phosphatase (APISO): 71 U/L (ref 36–130)
BUN: 12 mg/dL (ref 7–25)
CO2: 30 mmol/L (ref 20–32)
Calcium: 9.7 mg/dL (ref 8.6–10.3)
Chloride: 105 mmol/L (ref 98–110)
Creat: 1.04 mg/dL (ref 0.60–1.29)
Globulin: 2.8 g/dL (ref 1.9–3.7)
Glucose, Bld: 97 mg/dL (ref 65–99)
Potassium: 4.8 mmol/L (ref 3.5–5.3)
Sodium: 141 mmol/L (ref 135–146)
Total Bilirubin: 0.2 mg/dL (ref 0.2–1.2)
Total Protein: 7.1 g/dL (ref 6.1–8.1)
eGFR: 91 mL/min/{1.73_m2} (ref >=60)

## 2023-04-16 LAB — HIV-1 RNA QUANT-NO REFLEX-BLD
HIV 1 RNA Quant: NOT DETECTED {copies}/mL
HIV-1 RNA Quant, Log: NOT DETECTED {Log}

## 2023-04-16 LAB — DM TEMPLATE

## 2023-04-16 LAB — RPR: RPR Ser Ql: NONREACTIVE

## 2023-04-18 ENCOUNTER — Telehealth: Payer: Self-pay

## 2023-04-18 NOTE — Telephone Encounter (Signed)
-----   Message from [employer] sent at 04/16/2023  3:21 PM EST ----- Regarding: FW: UdS w NO cocaine hopefully one before anal pap will be ok unless this one can count? ----- Message ----- From: Janace Hoard Lab Results In Sent: 04/13/2023   4:10 PM EST To: Randall Hiss, MD

## 2023-04-18 NOTE — Telephone Encounter (Signed)
Patient informed of results and verbalized understanding. Patient had no questions.  Herbert Ellison [name], CMA

## 2023-04-22 ENCOUNTER — Ambulatory Visit: Payer: Medicaid Other | Admitting: Family Medicine

## 2023-04-27 ENCOUNTER — Ambulatory Visit: Payer: Self-pay | Admitting: General Surgery

## 2023-05-06 ENCOUNTER — Encounter: Payer: Self-pay | Admitting: Family Medicine

## 2023-05-06 ENCOUNTER — Ambulatory Visit: Payer: Medicaid Other | Admitting: Family Medicine

## 2023-05-06 ENCOUNTER — Other Ambulatory Visit: Payer: Self-pay

## 2023-05-06 VITALS — BP 112/78 | Ht 65.0 in | Wt 166.0 lb

## 2023-05-06 DIAGNOSIS — M19019 Primary osteoarthritis, unspecified shoulder: Secondary | ICD-10-CM

## 2023-05-06 DIAGNOSIS — G8929 Other chronic pain: Secondary | ICD-10-CM | POA: Diagnosis not present

## 2023-05-06 DIAGNOSIS — M25512 Pain in left shoulder: Secondary | ICD-10-CM | POA: Diagnosis not present

## 2023-05-06 MED ORDER — METHYLPREDNISOLONE ACETATE 40 MG/ML IJ SUSP
40.0000 mg | Freq: Once | INTRAMUSCULAR | Status: AC
  Administered 2023-05-06: 40 mg via INTRA_ARTICULAR

## 2023-05-06 NOTE — Progress Notes (Addendum)
 PCP: Patient, No Pcp Per  Chief Complaint: Left shoulder pain Subjective:   HPI: Patient is a 43 y.o. male here for Left shoulder pain.  Patient states that the pain started approximately 2 months ago and does not recall any inciting event.  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  Patient otherwise cannot remember any injury.  Patient states that the pain is located at the superior aspect of his shoulder right at the [facility] joint.  No other concerns at this time.    Past Medical History:  Diagnosis Date   Abnormal anal Papanicolaou smear    Dental caries 06/25/2019   Depression    Dizziness 06/21/2022   Headache 06/21/2022   HIV disease (HCC)    Hyperlipidemia 11/12/2021   Insomnia 03/06/2021   Myalgia 11/28/2020   Pap smear of anus with ASCUS 04/13/2023   Polysubstance abuse (HCC)    marijuana/  cocaine/ crack   Positive urine drug screen 04/12/2023   Vaccine counseling 11/24/2022    Current Outpatient Medications on File Prior to Visit  Medication Sig Dispense Refill   acetaminophen  (TYLENOL ) 500 MG tablet Take 500 mg by mouth every 6 (six) hours as needed for headache.     atorvastatin  (LIPITOR) 10 MG tablet Take 1 tablet (10 mg total) by mouth daily. 30 tablet 11   dolutegravir-lamiVUDine (DOVATO ) 50-300 MG tablet Take 1 tablet by mouth daily. 30 tablet 11   gabapentin  (NEURONTIN ) 300 MG capsule Take 1 capsule (300 mg total) by mouth at bedtime. 30 capsule 11   sodium fluoride  (SODIUM FLUORIDE  5000 PLUS) 1.1 % CREA dental cream Use a pea-size amount of toothpaste and brush at evening homecare.  Do not rinse. 51 g 5   triamcinolone  ointment (KENALOG ) 0.5 % Apply 1 Application topically 2 (two) times daily. To the neck (in Spanish) 30 g 2   No current facility-administered medications on file prior to visit.    Past Surgical History:  Procedure Laterality Date   NO PAST SURGERIES      No Known  Allergies  BP 112/78   Ht 5' 5 (1.651 m)   Wt 166 lb (75.3 kg)   BMI 27.62 kg/m       No data to display              No data to display              Objective:  Physical Exam:  Gen: NAD, comfortable in exam room  Left shoulder:Inspection left shoulder reveals no gross abnormality, no bony abnormality or angulation of the clavicle.  There is tenderness to palpation over the [facility] joint, no tenderness to palpation over the humeral head for the rotator cuff insertions.  Range of motion is full though there is some pain noted with abduction and adduction of the shoulder.  Strength is 5 out of 5 in internal, external rotation as well as abduction.  Special tests  Crossover test: Positive Hawkins: Negative Neer's: Negative   Assessment & Plan:  1. 1. Chronic left shoulder pain (Primary) - US  COMPLETE JOINT SPACE STRUCTURE UP LEFT; Future  2. AC joint arthropathy -Ultrasound of the [facility] joint does show significant osteophytic changes consistent with arthritis as well as a hypoechoic geyser sign consistent with fluid accumulation.  Discussed with patient that at this time he would benefit from steroid injection given the acuity as well as his pain.  Patient  was agreeable.  Patient tolerated procedure well without any difficulties. -Patient was also advised that given his job, it would be best if he took the next few days off.  As well as starting some rotator cuff strengthening.  Patient is understanding and agreeable with plan.  PROCEDURE: Risks & benefits of left shoulder u/s guided AC joint injection reviewed. Consent obtained. Time-out completed. Patient prepped and draped in the normal fashion. Musculoskeletal ultrasound used to identify appropriate anatomy. U/S exam showing osteophytic changes and effusion.   After identifying appropriate anatomy, patient positioned & area cleansed with chlorhexidine. Ethyl chloride spray used to anesthetize the skin. After ensuring adequate  anesthesia a solution of 1 mL 1% lidocaine  with 1 mL methylprednisolone  (Depo-medrol ) 40mg /mL injected into the left [facility] joint using a 22-gauge 1.5-inch needle via anterior approach under ultrasound guidance. Needle well-visualized in the joint. Images saved. Patient tolerated procedure well without any complications. Area covered with adhesive bandage. Post-procedure care reviewed. All questions answered.   Keilani Terrance MD, PGY-4  Sports Medicine Fellow [facility]

## 2023-06-03 ENCOUNTER — Other Ambulatory Visit: Payer: Self-pay

## 2023-06-03 ENCOUNTER — Encounter (HOSPITAL_BASED_OUTPATIENT_CLINIC_OR_DEPARTMENT_OTHER): Payer: Self-pay | Admitting: General Surgery

## 2023-06-10 ENCOUNTER — Ambulatory Visit (HOSPITAL_BASED_OUTPATIENT_CLINIC_OR_DEPARTMENT_OTHER): Admitting: Anesthesiology

## 2023-06-10 ENCOUNTER — Encounter (HOSPITAL_BASED_OUTPATIENT_CLINIC_OR_DEPARTMENT_OTHER): Payer: Self-pay | Admitting: General Surgery

## 2023-06-10 ENCOUNTER — Other Ambulatory Visit: Payer: Self-pay

## 2023-06-10 ENCOUNTER — Ambulatory Visit (HOSPITAL_BASED_OUTPATIENT_CLINIC_OR_DEPARTMENT_OTHER)
Admission: RE | Admit: 2023-06-10 | Discharge: 2023-06-10 | Disposition: A | Discharge status: Home / Self Care | Payer: Medicaid Other | Attending: General Surgery | Admitting: General Surgery

## 2023-06-10 ENCOUNTER — Encounter (HOSPITAL_BASED_OUTPATIENT_CLINIC_OR_DEPARTMENT_OTHER)
Admission: RE | Disposition: A | Discharge status: Home / Self Care | Payer: Self-pay | Source: Home / Self Care | Attending: General Surgery

## 2023-06-10 DIAGNOSIS — R85610 Atypical squamous cells of undetermined significance on cytologic smear of anus (ASC-US): Secondary | ICD-10-CM | POA: Diagnosis not present

## 2023-06-10 DIAGNOSIS — F17210 Nicotine dependence, cigarettes, uncomplicated: Secondary | ICD-10-CM | POA: Diagnosis not present

## 2023-06-10 DIAGNOSIS — Z21 Asymptomatic human immunodeficiency virus [HIV] infection status: Secondary | ICD-10-CM | POA: Insufficient documentation

## 2023-06-10 DIAGNOSIS — D378 Neoplasm of uncertain behavior of other specified digestive organs: Secondary | ICD-10-CM | POA: Diagnosis present

## 2023-06-10 DIAGNOSIS — F1290 Cannabis use, unspecified, uncomplicated: Secondary | ICD-10-CM | POA: Insufficient documentation

## 2023-06-10 DIAGNOSIS — R85619 Unspecified abnormal cytological findings in specimens from anus: Secondary | ICD-10-CM | POA: Diagnosis not present

## 2023-06-10 HISTORY — PX: HIGH RESOLUTION ANOSCOPY: SHX6345

## 2023-06-10 HISTORY — PX: RECTAL BIOPSY: SHX2303

## 2023-06-10 SURGERY — ANOSCOPY, HIGH RESOLUTION
Anesthesia: Monitor Anesthesia Care | Site: Rectum

## 2023-06-10 MED ORDER — ONDANSETRON HCL 4 MG/2ML IJ SOLN
INTRAMUSCULAR | Status: AC
  Filled 2023-06-10: qty 2

## 2023-06-10 MED ORDER — PROPOFOL 500 MG/50ML IV EMUL
INTRAVENOUS | Status: AC
  Filled 2023-06-10: qty 50

## 2023-06-10 MED ORDER — FENTANYL CITRATE (PF) 100 MCG/2ML IJ SOLN
INTRAMUSCULAR | Status: DC | PRN
Start: 2023-06-10 — End: 2023-06-10
  Administered 2023-06-10: 25 ug via INTRAVENOUS
  Administered 2023-06-10: 50 ug via INTRAVENOUS
  Administered 2023-06-10: 25 ug via INTRAVENOUS

## 2023-06-10 MED ORDER — LIDOCAINE 2% (20 MG/ML) 5 ML SYRINGE
INTRAMUSCULAR | Status: DC | PRN
  Administered 2023-06-10: 60 mg via INTRAVENOUS

## 2023-06-10 MED ORDER — ACETAMINOPHEN 500 MG PO TABS
1000.0000 mg | ORAL_TABLET | Freq: Once | ORAL | Status: AC
  Administered 2023-06-10: 1000 mg via ORAL

## 2023-06-10 MED ORDER — FENTANYL CITRATE (PF) 100 MCG/2ML IJ SOLN: 25.0000 ug | INTRAMUSCULAR | Status: DC | PRN

## 2023-06-10 MED ORDER — LACTATED RINGERS IV SOLN: INTRAVENOUS | Status: DC

## 2023-06-10 MED ORDER — STERILE WATER FOR IRRIGATION IR SOLN
Status: DC | PRN
  Administered 2023-06-10: 1000 mL

## 2023-06-10 MED ORDER — ACETIC ACID 5 % SOLN
Status: DC | PRN
  Administered 2023-06-10: 1 via TOPICAL

## 2023-06-10 MED ORDER — ONDANSETRON HCL 4 MG/2ML IJ SOLN
INTRAMUSCULAR | Status: DC | PRN
  Administered 2023-06-10: 4 mg via INTRAVENOUS

## 2023-06-10 MED ORDER — TRAMADOL HCL 50 MG PO TABS: 50.0000 mg | ORAL_TABLET | Freq: Four times a day (QID) | ORAL | 0 refills | Status: AC | PRN

## 2023-06-10 MED ORDER — LIDOCAINE 2% (20 MG/ML) 5 ML SYRINGE
INTRAMUSCULAR | Status: AC
  Filled 2023-06-10: qty 5

## 2023-06-10 MED ORDER — BUPIVACAINE-EPINEPHRINE 0.5% -1:200000 IJ SOLN
INTRAMUSCULAR | Status: DC | PRN
  Administered 2023-06-10: 30 mL

## 2023-06-10 MED ORDER — ACETAMINOPHEN 500 MG PO TABS
ORAL_TABLET | ORAL | Status: AC
  Filled 2023-06-10: qty 2

## 2023-06-10 MED ORDER — PROPOFOL 10 MG/ML IV BOLUS
INTRAVENOUS | Status: AC
  Filled 2023-06-10: qty 20

## 2023-06-10 MED ORDER — PROPOFOL 500 MG/50ML IV EMUL
INTRAVENOUS | Status: DC | PRN
  Administered 2023-06-10: 30 ug via INTRAVENOUS
  Administered 2023-06-10: 125 ug/kg/min via INTRAVENOUS
  Administered 2023-06-10: 20 ug via INTRAVENOUS

## 2023-06-10 MED ORDER — MIDAZOLAM HCL 2 MG/2ML IJ SOLN
INTRAMUSCULAR | Status: AC
  Filled 2023-06-10: qty 2

## 2023-06-10 MED ORDER — SODIUM CHLORIDE 0.9% FLUSH: 3.0000 mL | Freq: Two times a day (BID) | INTRAVENOUS | Status: DC

## 2023-06-10 MED ORDER — MIDAZOLAM HCL 2 MG/2ML IJ SOLN
INTRAMUSCULAR | Status: DC | PRN
Start: 2023-06-10 — End: 2023-06-10
  Administered 2023-06-10: 2 mg via INTRAVENOUS

## 2023-06-10 MED ORDER — FENTANYL CITRATE (PF) 100 MCG/2ML IJ SOLN
INTRAMUSCULAR | Status: AC
Start: 2023-06-10 — End: ?
  Filled 2023-06-10: qty 2

## 2023-06-10 SURGICAL SUPPLY — 34 items
APPLICATOR COTTON TIP 6 STRL (MISCELLANEOUS) IMPLANT
APPLICATOR COTTON TIP 6IN STRL (MISCELLANEOUS) ×1 IMPLANT
BENZOIN TINCTURE PRP APPL 2/3 (GAUZE/BANDAGES/DRESSINGS) ×2 IMPLANT
BRIEF MESH DISP 2XL (UNDERPADS AND DIAPERS) ×1 IMPLANT
COVER BACK TABLE 60X90IN (DRAPES) ×1 IMPLANT
DRAPE HYSTEROSCOPY (MISCELLANEOUS) IMPLANT
ELECT REM PT RETURN 9FT ADLT (ELECTROSURGICAL) ×1 IMPLANT
ELECTRODE REM PT RTRN 9FT ADLT (ELECTROSURGICAL) ×1 IMPLANT
GAUZE 4X4 16PLY ~~LOC~~+RFID DBL (SPONGE) ×1 IMPLANT
GAUZE PAD ABD 8X10 STRL (GAUZE/BANDAGES/DRESSINGS) ×1 IMPLANT
GAUZE SPONGE 4X4 12PLY STRL (GAUZE/BANDAGES/DRESSINGS) IMPLANT
GLOVE BIO SURGEON STRL SZ 6.5 (GLOVE) ×1 IMPLANT
GLOVE BIOGEL PI IND STRL 7.0 (GLOVE) IMPLANT
GLOVE INDICATOR 6.5 STRL GRN (GLOVE) ×1 IMPLANT
GLOVE SURG SS PI 7.0 STRL IVOR (GLOVE) IMPLANT
GOWN STRL REUS W/TWL LRG LVL3 (GOWN DISPOSABLE) IMPLANT
GOWN STRL REUS W/TWL XL LVL3 (GOWN DISPOSABLE) ×1 IMPLANT
HIBICLENS CHG 4% 4OZ BTL (MISCELLANEOUS) IMPLANT
KIT TURNOVER KIT B (KITS) ×1 IMPLANT
LEGGING LITHOTOMY PAIR STRL (DRAPES) IMPLANT
NDL HYPO 22X1.5 SAFETY MO (MISCELLANEOUS) ×1 IMPLANT
NEEDLE HYPO 22X1.5 SAFETY MO (MISCELLANEOUS) ×1 IMPLANT
PACK BASIN DAY SURGERY FS (CUSTOM PROCEDURE TRAY) ×1 IMPLANT
PENCIL SMOKE EVACUATOR (MISCELLANEOUS) ×1 IMPLANT
SHEET MEDIUM DRAPE 40X70 STRL (DRAPES) IMPLANT
SLEEVE SCD COMPRESS KNEE MED (STOCKING) ×1 IMPLANT
SOL PREP POV-IOD 4OZ 10% (MISCELLANEOUS) IMPLANT
SYR BULB IRRIG 60ML STRL (SYRINGE) ×1 IMPLANT
SYR CONTROL 10ML LL (SYRINGE) ×1 IMPLANT
TOWEL GREEN STERILE FF (TOWEL DISPOSABLE) ×1 IMPLANT
TRAY DSU PREP LF (CUSTOM PROCEDURE TRAY) ×1 IMPLANT
TUBE CONNECTING 20X1/4 (TUBING) ×1 IMPLANT
WATER STERILE IRR 1000ML UROMA (IV SOLUTION) IMPLANT
YANKAUER SUCT BULB TIP NO VENT (SUCTIONS) ×1 IMPLANT

## 2023-06-10 NOTE — Anesthesia Postprocedure Evaluation (Signed)
 Anesthesia Post Note  Patient: Herbert Ellison  Procedure(s) Performed: ANOSCOPY, HIGH RESOLUTION (Rectum) BIOPSY/ABLATION (Rectum)     Patient location during evaluation: PACU Anesthesia Type: MAC Level of consciousness: awake and alert Pain management: pain level controlled Vital Signs Assessment: post-procedure vital signs reviewed and stable Respiratory status: spontaneous breathing, nonlabored ventilation and respiratory function stable Cardiovascular status: stable and blood pressure returned to baseline Postop Assessment: no apparent nausea or vomiting Anesthetic complications: no  No notable events documented.  Last Vitals:  Vitals:   06/10/23 1015 06/10/23 1023  BP: 103/63 111/60  Pulse: 71 64  Resp: (!) 23 12  Temp:    SpO2: 93% 94%    Last Pain:  Vitals:   06/10/23 1032  TempSrc:   PainSc: 0-No pain                 Vicie Cech,W. EDMOND

## 2023-06-10 NOTE — Discharge Instructions (Addendum)
 Beginning the day after surgery:  You may sit in a tub of warm water 2-3 times a day to relieve discomfort.  Eat a regular diet high in fiber.  Avoid foods that give you constipation or diarrhea.  Avoid foods that are difficult to digest, such as seeds, nuts, corn or popcorn.  Do not go any longer than 2 days without a bowel movement.  You may take a dose of Milk of Magnesia if you become constipated.    Drink 6-8 glasses of water daily.  Walking is encouraged.  Avoid strenuous activity and heavy lifting for one month after surgery.    Call the office if you have any questions or concerns.  Call immediately if you develop:  Excessive rectal bleeding (more than a cup or passing large clots) Increased discomfort Fever greater than 100 F Difficulty urinating    Post Anesthesia Home Care Instructions  Activity: Get plenty of rest for the remainder of the day. A responsible individual must stay with you for 24 hours following the procedure.  For the next 24 hours, DO NOT: -Drive a car -Advertising copywriter -Drink alcoholic beverages -Take any medication unless instructed by your physician -Make any legal decisions or sign important papers.  Meals: Start with liquid foods such as gelatin or soup. Progress to regular foods as tolerated. Avoid greasy, spicy, heavy foods. If nausea and/or vomiting occur, drink only clear liquids until the nausea and/or vomiting subsides. Call your physician if vomiting continues.  Special Instructions/Symptoms: Your throat may feel dry or sore from the anesthesia or the breathing tube placed in your throat during surgery. If this causes discomfort, gargle with warm salt water. The discomfort should disappear within 24 hours.  If you had a scopolamine patch placed behind your ear for the management of post- operative nausea and/or vomiting:  1. The medication in the patch is effective for 72 hours, after which it should be removed.  Wrap patch in a tissue  and discard in the trash. Wash hands thoroughly with soap and water. 2. You may remove the patch earlier than 72 hours if you experience unpleasant side effects which may include dry mouth, dizziness or visual disturbances. 3. Avoid touching the patch. Wash your hands with soap and water after contact with the patch.    Next dose of tylenol at 2:15pm

## 2023-06-10 NOTE — Op Note (Signed)
 06/10/2023  10:00 AM  PATIENT:  Herbert Ellison  43 y.o. male  Patient Care Team: Patient, No Pcp Per as PCP - General (General Practice) Comer, Belia Heman, MD as PCP - Infectious Diseases (Infectious Diseases)  PRE-OPERATIVE DIAGNOSIS:  PAP SMEAR OF ANUS WITH ATYPICAL SQUAMOUS CELLS OF UNDETERMINED SIGNIFICANCE  POST-OPERATIVE DIAGNOSIS:  PAP SMEAR OF ANUS WITH ATYPICAL SQUAMOUS CELLS OF UNDETERMINED SIGNIFICANCE  PROCEDURE:  ANOSCOPY, HIGH RESOLUTION BIOPSY/ABLATION    Surgeon(s): Romie Levee, MD  ASSISTANT: none   ANESTHESIA:   local and MAC  EBL: 5ml  Total I/O In: 250 [I.V.:250] Out: 5 [Blood:5]  SPECIMEN:  Source of Specimen:  posterior anal canal  DISPOSITION OF SPECIMEN:  PATHOLOGY  COUNTS:  YES  PLAN OF CARE: Discharge to home after PACU  PATIENT DISPOSITION:  PACU - hemodynamically stable.  INDICATION: 43 y.o. M with HIV and ASCUS on anal Pap test  OR FINDINGS: large, discrete, aceto-white staining lesion throughout the R posterior anal canal  DESCRIPTION: The patient was identified in the preoperative holding area and taken to the OR where they were laid supine on the operating room table in lithotomy position. MAC anesthesia was smoothly induced.  The patient was then prepped and draped in the usual sterile fashion. A surgical timeout was performed indicating the correct patient, procedure, positioning and preoperative antibioitics. SCDs were noted to be in place and functioning prior to the operation.   After this was completed, a sponge was soaked in 5% acetic acid was placed over the perianal region. This was allowed to soak for 2 minutes. The sponge was removed and the perianal region was evaluated with a colposcope.  There were no external lesion noted.  The internal anal canal was evaluated via anoscopy with a Hill-Ferguson anoscope.  There was a large, discrete posterior lesion traversing the dentate line in the R posterior anal canal.  It did  stain acetowhite but did not stain with Lugols.  A small biopsy was taken and the area was ablated with electrocautery.   Additional local anesthesia was injected under the biopsy site.   A sterile dressing was applied over this. The patient was then awakened from anesthesia and sent to the postanesthesia care unit in stable condition. All counts were correct operating room staff.   Vanita Panda, MD  Colorectal and General Surgery [facility]

## 2023-06-10 NOTE — Transfer of Care (Signed)
 Immediate Anesthesia Transfer of Care Note  Patient: Herbert Ellison  Procedure(s) Performed: ANOSCOPY, HIGH RESOLUTION (Rectum) BIOPSY/ABLATION (Rectum)  Patient Location: PACU  Anesthesia Type:MAC  Level of Consciousness: drowsy  Airway & Oxygen Therapy: Patient Spontanous Breathing and Patient connected to face mask oxygen  Post-op Assessment: Report given to RN and Post -op Vital signs reviewed and stable  Post vital signs: Reviewed and stable  Last Vitals:  Vitals Value Taken Time  BP    Temp    Pulse 70 06/10/23 1004  Resp 21 06/10/23 1004  SpO2 98 % 06/10/23 1004  Vitals shown include unfiled device data.  Last Pain:  Vitals:   06/10/23 0815  TempSrc:   PainSc: 0-No pain         Complications: No notable events documented.

## 2023-06-10 NOTE — Anesthesia Preprocedure Evaluation (Addendum)
 Anesthesia Evaluation  Patient identified by MRN, date of birth, ID band Patient awake    Reviewed: Allergy & Precautions, H&P , NPO status , Patient's Chart, lab work & pertinent test results  Airway Mallampati: II  TM Distance: >3 FB Neck ROM: Full    Dental no notable dental hx. (+) Teeth Intact, Dental Advisory Given   Pulmonary Current Smoker and Patient abstained from smoking.   Pulmonary exam normal breath sounds clear to auscultation       Cardiovascular negative cardio ROS  Rhythm:Regular Rate:Normal     Neuro/Psych  Headaches   Depression       GI/Hepatic negative GI ROS,,,(+)     substance abuse  cocaine use and marijuana use  Endo/Other  negative endocrine ROS    Renal/GU negative Renal ROS  negative genitourinary   Musculoskeletal   Abdominal   Peds  Hematology  (+) HIV  Anesthesia Other Findings   Reproductive/Obstetrics negative OB ROS                             Anesthesia Physical Anesthesia Plan  ASA: 2  Anesthesia Plan: MAC   Post-op Pain Management: Tylenol PO (pre-op)*   Induction: Intravenous  PONV Risk Score and Plan: 1 and Propofol infusion, Midazolam and Ondansetron  Airway Management Planned: Natural Airway and Simple Face Mask  Additional Equipment:   Intra-op Plan:   Post-operative Plan:   Informed Consent: I have reviewed the patients History and Physical, chart, labs and discussed the procedure including the risks, benefits and alternatives for the proposed anesthesia with the patient or authorized representative who has indicated his/her understanding and acceptance.     Dental advisory given  Plan Discussed with: CRNA  Anesthesia Plan Comments:        Anesthesia Quick Evaluation

## 2023-06-10 NOTE — H&P (Signed)
  REFERRING PHYSICIAN:  Dr Daiva Eves   PROVIDER:  Elenora Gamma, MD   MRN: [id number] DOB: [birth year]  Subjective      History of Present Illness: Herbert Ellison is a 43 y.o. male who is seen today as an office consultation at the request of Dr Daiva Eves for evaluation of No chief complaint on file. .  Patient recently had an anal Pap test.  This showed ASCUS.  High risk HPV was negative.  He is here today for further discussion on follow-up testing.     Review of Systems: A complete review of systems was obtained from the patient.  I have reviewed this information and discussed as appropriate with the patient.  See HPI as well for other ROS.     Medical History: Past Medical History      Past Medical History:  Diagnosis Date   Anxiety     HIV -AIDS with opportunistic infection, Symptomatic (CMS/HHS-HCC)     Substance abuse (CMS/HHS-HCC)           Problem List  There is no problem list on file for this patient.      Past Surgical History  History reviewed. No pertinent surgical history.      Allergies  No Known Allergies     Medications Ordered Prior to Encounter        Current Outpatient Medications on File Prior to Visit  Medication Sig Dispense Refill   DOVATO 50-300 mg Tab Take 1 tablet by mouth once daily        No current facility-administered medications on file prior to visit.        Family History       Family History  Problem Relation Age of Onset   Hyperlipidemia (Elevated cholesterol) Mother     Coronary Artery Disease (Blocked arteries around heart) Mother     Diabetes Mother          Tobacco Use History  Social History        Tobacco Use  Smoking Status Every Day   Types: Cigarettes  Smokeless Tobacco Never        Social History  Social History         Socioeconomic History   Marital status: Married  Tobacco Use   Smoking status: Every Day      Types: Cigarettes   Smokeless tobacco: Never  Substance and  Sexual Activity   Alcohol use: Never   Drug use: Yes      Types: "Crack" cocaine, Marijuana        Objective:      Vitals:   06/10/23 0805  BP: 111/85  Pulse: 63  Resp: 18  Temp: 98 F (36.7 C)  SpO2: 98%    Exam Gen: NAD CV: RRR Pulm: CTA Abd: soft   Labs, Imaging and Diagnostic Testing: CD4: 405 HIV: ND   Assessment and Plan:  Diagnoses and all orders for this visit:   Pap smear of anus with ASCUS   43 year old male with well-controlled HIV who presents to the office for evaluation of ASCUS found on recent anal Pap test.  We discussed proceeding with high-resolution anoscopy with possible biopsy or ablation.  We discussed this in detail including postoperative pain and bleeding that can be associated with treatment.  All questions were answered.  Patient would like to proceed.   Vanita Panda, MD Colon and Rectal Surgery [facility]

## 2023-06-11 ENCOUNTER — Encounter (HOSPITAL_BASED_OUTPATIENT_CLINIC_OR_DEPARTMENT_OTHER): Payer: Self-pay | Admitting: General Surgery

## 2023-06-13 LAB — SURGICAL PATHOLOGY

## 2023-06-23 ENCOUNTER — Other Ambulatory Visit: Payer: Self-pay

## 2023-06-23 ENCOUNTER — Ambulatory Visit

## 2023-07-19 NOTE — Progress Notes (Unsigned)
 Subjective:  Chief complaint: follow-up for HIV disease on medications   Patient ID: Herbert Ellison, male    DOB: [birth year], 43 y.o.   MRN: [id number]  HPI  Discussed the use of AI scribe software for clinical note transcription with the patient, who gave verbal consent to proceed.  History of Present Illness   Herbert Ellison with a history of HIV, presents with a dental infection. He has an upcoming dental appointment on May 20th. He has been managing his sleep issues with a fruit tea, which has allowed him to discontinue gabapentin . He is also on atorvastatin  for cholesterol and inflammation management. The patient is planning to move to [place]  and is aware that he will need to transfer his Medicaid coverage. He has previously lived in [place]  and has a known clinic to attend. He is requesting additional medication samples to take with him in case he is unable to return for his May appointment.       Past Medical History:  Diagnosis Date   Abnormal anal Papanicolaou smear    Dental caries 06/25/2019   Depression    Dizziness 06/21/2022   Headache 06/21/2022   HIV disease (HCC)    Hyperlipidemia 11/12/2021   Insomnia 03/06/2021   Myalgia 11/28/2020   Pap smear of anus with ASCUS 04/13/2023   Polysubstance abuse (HCC)    marijuana/  cocaine/ "crack"   Positive urine drug screen 04/12/2023   Vaccine counseling 11/24/2022    Past Surgical History:  Procedure Laterality Date   HIGH RESOLUTION ANOSCOPY N/A 06/10/2023   Procedure: ANOSCOPY, HIGH RESOLUTION;  Surgeon: Joyce Nixon, MD;  Location: [facility];  Service: General;  Laterality: N/A;   NO PAST SURGERIES     RECTAL BIOPSY N/A 06/10/2023   Procedure: BIOPSY/ABLATION;  Surgeon: Joyce Nixon, MD;  Location: [facility];  Service: General;  Laterality: N/A;    Family History  Problem Relation Age of Onset   Congestive Heart Failure Mother        Deceased   Diabetes Mellitus II  Maternal Uncle       Social History   Socioeconomic History   Marital status: Single    Spouse name: Not on file   Number of children: Not on file   Years of education: Not on file   Highest education level: Not on file  Occupational History   Not on file  Tobacco Use   Smoking status: Every Day    Current packs/day: 0.50    Types: Cigarettes   Smokeless tobacco: Never   Tobacco comments:    03-09-2023  per pt smokes 1/2 ppd,  started age 60  Vaping Use   Vaping status: Never Used  Substance and Sexual Activity   Alcohol use: Not Currently    Comment: 03-09-2023 last alcohol 4-5 months ago   Drug use: Yes    Frequency: 6.0 times per week    Types: Marijuana, Cocaine, "Crack" cocaine    Comment: 03-09-2023  per pt smokes marijuana at nightly  for bodyaches;  last cocaine one month ago/ "crack" last week   Sexual activity: Yes    Partners: Female    Comment: DECLINED CONDOMS  Other Topics Concern   Not on file  Social History Narrative   Not on file   Social Drivers of Health   Financial Resource Strain: Not on file  Food Insecurity: Not on file  Transportation Needs: Not on file  Physical Activity: Not on file  Stress: Not on  file  Social Connections: Not on file    No Known Allergies   Current Outpatient Medications:    acetaminophen  (TYLENOL ) 500 MG tablet, Take 500 mg by mouth every 6 (six) hours as needed for headache., Disp: , Rfl:    atorvastatin  (LIPITOR) 10 MG tablet, Take 1 tablet (10 mg total) by mouth daily., Disp: 30 tablet, Rfl: 11   dolutegravir-lamiVUDine (DOVATO ) 50-300 MG tablet, Take 1 tablet by mouth daily., Disp: 30 tablet, Rfl: 11   sodium fluoride  (SODIUM FLUORIDE  5000 PLUS) 1.1 % CREA dental cream, Use a pea-size amount of toothpaste and brush at evening homecare.  Do not rinse., Disp: 51 g, Rfl: 5   traMADol  (ULTRAM ) 50 MG tablet, Take 1 tablet (50 mg total) by mouth every 6 (six) hours as needed., Disp: 15 tablet, Rfl: 0   triamcinolone   ointment (KENALOG ) 0.5 %, Apply 1 Application topically 2 (two) times daily. To the neck (in Spanish), Disp: 30 g, Rfl: 2   Review of Systems  Constitutional:  Negative for activity change, appetite change, chills, diaphoresis, fatigue, fever and unexpected weight change.  HENT:  Negative for congestion, rhinorrhea, sinus pressure, sneezing, sore throat and trouble swallowing.   Eyes:  Negative for photophobia and visual disturbance.  Respiratory:  Negative for cough, chest tightness, shortness of breath, wheezing and stridor.   Cardiovascular:  Negative for chest pain, palpitations and leg swelling.  Gastrointestinal:  Negative for abdominal distention, abdominal pain, anal bleeding, blood in stool, constipation, diarrhea, nausea and vomiting.  Genitourinary:  Negative for difficulty urinating, dysuria, flank pain and hematuria.  Musculoskeletal:  Negative for arthralgias, back pain, gait problem, joint swelling and myalgias.  Skin:  Negative for color change, pallor, rash and wound.  Neurological:  Negative for dizziness, tremors, weakness and light-headedness.  Hematological:  Negative for adenopathy. Does not bruise/bleed easily.  Psychiatric/Behavioral:  Negative for agitation, behavioral problems, confusion, decreased concentration, dysphoric mood and sleep disturbance.        Objective:   Physical Exam Constitutional:      Appearance: He is well-developed.  HENT:     Head: Normocephalic and atraumatic.     Mouth/Throat:     Dentition: Dental tenderness and gingival swelling present.  Eyes:     Conjunctiva/sclera: Conjunctivae normal.  Cardiovascular:     Rate and Rhythm: Normal rate and regular rhythm.  Pulmonary:     Effort: Pulmonary effort is normal. No respiratory distress.     Breath sounds: No wheezing.  Abdominal:     General: There is no distension.     Palpations: Abdomen is soft.  Musculoskeletal:        General: No tenderness. Normal range of motion.      Cervical back: Normal range of motion and neck supple.  Skin:    General: Skin is warm and dry.     Coloration: Skin is not pale.     Findings: No erythema or rash.  Neurological:     General: No focal deficit present.     Mental Status: He is alert and oriented to person, place, and time.  Psychiatric:        Mood and Affect: Mood normal.        Behavior: Behavior normal.        Thought Content: Thought content normal.        Judgment: Judgment normal.           Assessment & Plan:   Assessment and Plan    HIV infection HIV  well-managed with Dovato  - Provide two bottles of Dovato , each with 14 pills. - Check viral load and CD4 count.   STI testing - Offer STI testing, including gonorrhea and chlamydia OP , rectum and urine, RPR in serum  Dental infection Acute dental infection requires antibiotics. No allergies to penicillin or Augmentin . Dental appointment on May 20th. - Prescribe Augmentin  for two weeks with refill option. - Send prescription to [facility].  Hyperlipidemia Managed with atorvastatin  to reduce cholesterol and inflammation. - Continue atorvastatin  as prescribed.   ASCUS: HRA was normal

## 2023-07-20 ENCOUNTER — Ambulatory Visit (INDEPENDENT_AMBULATORY_CARE_PROVIDER_SITE_OTHER): Payer: Medicaid Other | Admitting: Infectious Disease

## 2023-07-20 ENCOUNTER — Other Ambulatory Visit: Payer: Self-pay | Admitting: Pharmacist

## 2023-07-20 ENCOUNTER — Encounter: Payer: Self-pay | Admitting: Infectious Disease

## 2023-07-20 ENCOUNTER — Other Ambulatory Visit: Payer: Self-pay

## 2023-07-20 ENCOUNTER — Other Ambulatory Visit (HOSPITAL_COMMUNITY)
Admission: RE | Admit: 2023-07-20 | Discharge: 2023-07-20 | Disposition: A | Discharge status: Home / Self Care | Source: Ambulatory Visit | Attending: Infectious Disease | Admitting: Infectious Disease

## 2023-07-20 VITALS — BP 138/88 | HR 82 | Temp 98.2°F | Ht 65.0 in | Wt 177.0 lb

## 2023-07-20 DIAGNOSIS — B20 Human immunodeficiency virus [HIV] disease: Secondary | ICD-10-CM | POA: Diagnosis present

## 2023-07-20 DIAGNOSIS — Z113 Encounter for screening for infections with a predominantly sexual mode of transmission: Secondary | ICD-10-CM

## 2023-07-20 DIAGNOSIS — K047 Periapical abscess without sinus: Secondary | ICD-10-CM | POA: Diagnosis not present

## 2023-07-20 DIAGNOSIS — T63441A Toxic effect of venom of bees, accidental (unintentional), initial encounter: Secondary | ICD-10-CM

## 2023-07-20 DIAGNOSIS — E785 Hyperlipidemia, unspecified: Secondary | ICD-10-CM | POA: Diagnosis not present

## 2023-07-20 DIAGNOSIS — G4700 Insomnia, unspecified: Secondary | ICD-10-CM

## 2023-07-20 DIAGNOSIS — Z7185 Encounter for immunization safety counseling: Secondary | ICD-10-CM

## 2023-07-20 DIAGNOSIS — R85610 Atypical squamous cells of undetermined significance on cytologic smear of anus (ASC-US): Secondary | ICD-10-CM

## 2023-07-20 MED ORDER — DOVATO 50-300 MG PO TABS: 1.0000 | ORAL_TABLET | Freq: Every day | ORAL | Status: AC

## 2023-07-20 MED ORDER — ATORVASTATIN CALCIUM 10 MG PO TABS: 10.0000 mg | ORAL_TABLET | Freq: Every day | ORAL | 11 refills | Status: DC

## 2023-07-20 MED ORDER — AMOXICILLIN-POT CLAVULANATE 875-125 MG PO TABS: 1.0000 | ORAL_TABLET | Freq: Two times a day (BID) | ORAL | 1 refills | Status: AC

## 2023-07-20 MED ORDER — DOVATO 50-300 MG PO TABS: 1.0000 | ORAL_TABLET | Freq: Every day | ORAL | 11 refills | Status: DC

## 2023-07-20 NOTE — Progress Notes (Signed)
 Medication Samples have been provided to the patient.  Drug name: Dovato         Strength: 50/300 mg         Qty: 2 bottles (28 tablets)   LOT: [id number]   Exp.Date: 09/2024  Samples requested by Dr. Ernie Heal.  Dosing instructions: Take one tablet by mouth once daily  The patient has been instructed regarding the correct time, dose, and frequency of taking this medication, including desired effects and most common side effects.   Yeva Bissette L. Margart Shears, PharmD, BCIDP, AAHIVP, CPP Clinical Pharmacist Practitioner Infectious Diseases Clinical Pharmacist [facility] 03/10/2020, 10:07 AM

## 2023-07-21 LAB — URINE CYTOLOGY ANCILLARY ONLY
Chlamydia: NEGATIVE
Comment: NEGATIVE
Comment: NORMAL
Neisseria Gonorrhea: NEGATIVE

## 2023-07-21 LAB — CYTOLOGY, (ORAL, ANAL, URETHRAL) ANCILLARY ONLY
Chlamydia: NEGATIVE
Chlamydia: NEGATIVE
Comment: NEGATIVE
Comment: NEGATIVE
Comment: NORMAL
Comment: NORMAL
Neisseria Gonorrhea: NEGATIVE
Neisseria Gonorrhea: NEGATIVE

## 2023-07-21 LAB — T-HELPER CELLS (CD4) COUNT (NOT AT ARMC)
CD4 % Helper T Cell: 27 % — ABNORMAL LOW (ref 33–65)
CD4 T Cell Abs: 376 /uL — ABNORMAL LOW (ref 400–1790)

## 2023-07-23 LAB — COMPLETE METABOLIC PANEL WITHOUT GFR
AG Ratio: 1.7 (calc) (ref 1.0–2.5)
ALT: 17 U/L (ref 9–46)
AST: 15 U/L (ref 10–40)
Albumin: 4.6 g/dL (ref 3.6–5.1)
Alkaline phosphatase (APISO): 71 U/L (ref 36–130)
BUN: 9 mg/dL (ref 7–25)
CO2: 28 mmol/L (ref 20–32)
Calcium: 9.6 mg/dL (ref 8.6–10.3)
Chloride: 106 mmol/L (ref 98–110)
Creat: 0.92 mg/dL (ref 0.60–1.29)
Globulin: 2.7 g/dL (ref 1.9–3.7)
Glucose, Bld: 101 mg/dL — ABNORMAL HIGH (ref 65–99)
Potassium: 4.2 mmol/L (ref 3.5–5.3)
Sodium: 140 mmol/L (ref 135–146)
Total Bilirubin: 0.3 mg/dL (ref 0.2–1.2)
Total Protein: 7.3 g/dL (ref 6.1–8.1)

## 2023-07-23 LAB — CBC WITH DIFFERENTIAL/PLATELET
Absolute Lymphocytes: 1414 {cells}/uL (ref 850–3900)
Absolute Monocytes: 477 {cells}/uL (ref 200–950)
Basophils Absolute: 19 {cells}/uL (ref 0–200)
Basophils Relative: 0.3 %
Eosinophils Absolute: 118 {cells}/uL (ref 15–500)
Eosinophils Relative: 1.9 %
HCT: 42.6 % (ref 38.5–50.0)
Hemoglobin: 14.3 g/dL (ref 13.2–17.1)
MCH: 30.3 pg (ref 27.0–33.0)
MCHC: 33.6 g/dL (ref 32.0–36.0)
MCV: 90.3 fL (ref 80.0–100.0)
MPV: 10.2 fL (ref 7.5–12.5)
Monocytes Relative: 7.7 %
Neutro Abs: 4173 {cells}/uL (ref 1500–7800)
Neutrophils Relative %: 67.3 %
Platelets: 272 10*3/uL (ref 140–400)
RBC: 4.72 10*6/uL (ref 4.20–5.80)
RDW: 13.1 % (ref 11.0–15.0)
Total Lymphocyte: 22.8 %
WBC: 6.2 10*3/uL (ref 3.8–10.8)

## 2023-07-23 LAB — HIV-1 RNA QUANT-NO REFLEX-BLD
HIV 1 RNA Quant: NOT DETECTED {copies}/mL
HIV-1 RNA Quant, Log: NOT DETECTED {Log_copies}/mL

## 2023-07-23 LAB — LIPID PANEL
Cholesterol: 240 mg/dL — ABNORMAL HIGH (ref <200)
HDL: 45 mg/dL (ref >=40)
LDL Cholesterol (Calc): 177 mg/dL — ABNORMAL HIGH
Non-HDL Cholesterol (Calc): 195 mg/dL — ABNORMAL HIGH (ref <130)
Total CHOL/HDL Ratio: 5.3 (calc) — ABNORMAL HIGH (ref <5.0)
Triglycerides: 74 mg/dL (ref <150)

## 2023-07-23 LAB — RPR: RPR Ser Ql: NONREACTIVE

## 2023-08-08 NOTE — Progress Notes (Signed)
 The ASCVD Risk score (Arnett DK, et al., 2019) failed to calculate for the following reasons:   Unable to determine if patient is Non-Hispanic African American  Herbert Ellison, Scientist, research (physical sciences), Charity fundraiser

## 2023-08-16 ENCOUNTER — Ambulatory Visit

## 2023-08-16 ENCOUNTER — Other Ambulatory Visit: Payer: Self-pay

## 2023-09-13 NOTE — Progress Notes (Signed)
 Virtual Visit via Video Note  I connected with Herbert Ellison on 09/14/23 at 10:30 AM EDT by a video enabled telemedicine application and verified that I am speaking with the correct person using two identifiers.  Location: Patient: Home Provider: [facility]   I discussed the limitations of evaluation and management by telemedicine and the availability of in person appointments. The patient expressed understanding and agreed to proceed.NOTE we used Spanish interpreter who was in person with me on my end of video feed.  History of Present Illness:    Discussed the use of AI scribe software for clinical note transcription with the patient, who gave verbal consent to proceed.  History of Present Illness   Herbert Ellison is a 43 year old male with HIV who presents with recurring genital lesions.  He experiences recurring small bumps on his neck and also on his penis that resolve with triamcinolone  cream. The lesions reappear approximately every month and a half.    He also does have , history of abnormal anal cells due to HPV but reassuring HRA with Dr. Andy Bannister   He is on Dovato  for HIV management, with no detectable viral load and a CD4 count of 374, indicating well-controlled HIV. STI testing in April was negative for gonorrhea and chlamydia.      Past Medical History:  Diagnosis Date   Abnormal anal Papanicolaou smear    Dental caries 06/25/2019   Depression    Dizziness 06/21/2022   Headache 06/21/2022   HIV disease (HCC)    Hyperlipidemia 11/12/2021   Insomnia 03/06/2021   Myalgia 11/28/2020   Pap smear of anus with ASCUS 04/13/2023   Polysubstance abuse (HCC)    marijuana/  cocaine/ crack   Positive urine drug screen 04/12/2023   Vaccine counseling 11/24/2022    Past Surgical History:  Procedure Laterality Date   HIGH RESOLUTION ANOSCOPY N/A 06/10/2023   Procedure: ANOSCOPY, HIGH RESOLUTION;  Surgeon: Joyce Nixon, MD;  Location: [facility];  Service: General;  Laterality: N/A;   NO PAST SURGERIES     RECTAL BIOPSY N/A 06/10/2023   Procedure: BIOPSY/ABLATION;  Surgeon: Joyce Nixon, MD;  Location:  SURGERY CENTER;  Service: General;  Laterality: N/A;    Family History  Problem Relation Age of Onset   Congestive Heart Failure Mother        Deceased   Diabetes Mellitus II Maternal Uncle       Social History   Socioeconomic History   Marital status: Single    Spouse name: Not on file   Number of children: Not on file   Years of education: Not on file   Highest education level: Not on file  Occupational History   Not on file  Tobacco Use   Smoking status: Every Day    Current packs/day: 0.50    Types: Cigarettes   Smokeless tobacco: Never   Tobacco comments:    03-09-2023  per pt smokes 1/2 ppd,  started age 65  Vaping Use   Vaping status: Never Used  Substance and Sexual Activity   Alcohol use: Not Currently    Comment: 03-09-2023 last alcohol 4-5 months ago   Drug use: Yes    Frequency: 6.0 times per week    Types: Marijuana, Cocaine, Crack cocaine    Comment: 03-09-2023  per pt smokes marijuana at nightly  for bodyaches;  last cocaine one month ago/ crack last week   Sexual activity: Yes    Partners: Female  Comment: DECLINED CONDOMS  Other Topics Concern   Not on file  Social History Narrative   Not on file   Social Drivers of Health   Financial Resource Strain: Not on file  Food Insecurity: Not on file  Transportation Needs: Not on file  Physical Activity: Not on file  Stress: Not on file  Social Connections: Not on file    No Known Allergies   Current Outpatient Medications:    atorvastatin  (LIPITOR) 20 MG tablet, Take 1 tablet (20 mg total) by mouth daily., Disp: 30 tablet, Rfl: 11   acetaminophen  (TYLENOL ) 500 MG tablet, Take 500 mg by mouth every 6 (six) hours as needed for headache. (Patient not taking: Reported on 07/20/2023), Disp: , Rfl:     amoxicillin -clavulanate (AUGMENTIN ) 875-125 MG tablet, Take 1 tablet by mouth 2 (two) times daily., Disp: 28 tablet, Rfl: 1   dolutegravir-lamiVUDine (DOVATO ) 50-300 MG tablet, Take 1 tablet by mouth daily., Disp: 30 tablet, Rfl: 11   sodium fluoride  (SODIUM FLUORIDE  5000 PLUS) 1.1 % CREA dental cream, Use a pea-size amount of toothpaste and brush at evening homecare.  Do not rinse., Disp: 51 g, Rfl: 5   traMADol  (ULTRAM ) 50 MG tablet, Take 1 tablet (50 mg total) by mouth every 6 (six) hours as needed. (Patient not taking: Reported on 07/20/2023), Disp: 15 tablet, Rfl: 0   triamcinolone  ointment (KENALOG ) 0.5 %, Apply 1 Application topically 2 (two) times daily. To the neck (in Spanish), Disp: 30 g, Rfl: 2  Observations/Objective:  Herbert Ellison was in NAD and comfortable on video feed  Assessment and Plan:  Assessment and Plan    HIV infection HIV well-controlled with undetectable viral load and CD4 count of 374. On Dovato  for management. - Continue Dovato . - found [facility] clinic in [place]  where he had been before and provided phone number  Hypercholesterolemia Elevated cholesterol levels with previous prescription misplaced. - Prescribe cholesterol-lowering medication, atorvastatin    Lesions on penis; wonder if they are herpetic ones that are coming and going. would be best to see them   Rash on neck: responded to steroids, will refill triamcinolone    Abnormal anal pap; HRA reassuring but will need plug into rectal cancer screening in [place]  when he moves there        Follow Up Instructions:    I discussed the assessment and treatment plan with the patient. The patient was provided an opportunity to ask questions and all were answered. The patient agreed with the plan and demonstrated an understanding of the instructions.   The patient was advised to call back or seek an in-person evaluation if the symptoms worsen or if the condition fails to improve as anticipated.  I have  personally spent 27 minutes involved in face-to-face and non-face-to-face activities for this patient on the day of the visit. Professional time spent includes the following activities: Preparing to see the patient (review of HIV RNA< CD4, pathology Obtaining and/or reviewing separately obtained history HRA procedure, clinic notes Performing a medically appropriate examination and/or evaluation , Ordering Dovato , atorvastatin , steroid creamreferring and communicating with other health care professionals, Documenting clinical information in the EMR, Independently interpreting results (not separately reported), Communicating results to the patient/family/caregiver, Counseling and educating the patient/and Care coordination (not separately reported).     Sheela Denmark, MD   Subjective:  Chief complaint: follow-up for HIV disease on medications   Patient ID: Herbert Ellison, male    DOB: 11-Sep-1980, 43 y.o.   MRN: [id number]  HPI   Past  Medical History:  Diagnosis Date   Abnormal anal Papanicolaou smear    Dental caries 06/25/2019   Depression    Dizziness 06/21/2022   Headache 06/21/2022   HIV disease (HCC)    Hyperlipidemia 11/12/2021   Insomnia 03/06/2021   Myalgia 11/28/2020   Pap smear of anus with ASCUS 04/13/2023   Polysubstance abuse (HCC)    marijuana/  cocaine/ crack   Positive urine drug screen 04/12/2023   Vaccine counseling 11/24/2022    Past Surgical History:  Procedure Laterality Date   HIGH RESOLUTION ANOSCOPY N/A 06/10/2023   Procedure: ANOSCOPY, HIGH RESOLUTION;  Surgeon: Joyce Nixon, MD;  Location: [facility];  Service: General;  Laterality: N/A;   NO PAST SURGERIES     RECTAL BIOPSY N/A 06/10/2023   Procedure: BIOPSY/ABLATION;  Surgeon: Joyce Nixon, MD;  Location: [facility];  Service: General;  Laterality: N/A;    Family History  Problem Relation Age of Onset   Congestive Heart Failure Mother        Deceased    Diabetes Mellitus II Maternal Uncle       Social History   Socioeconomic History   Marital status: Single    Spouse name: Not on file   Number of children: Not on file   Years of education: Not on file   Highest education level: Not on file  Occupational History   Not on file  Tobacco Use   Smoking status: Every Day    Current packs/day: 0.50    Types: Cigarettes   Smokeless tobacco: Never   Tobacco comments:    03-09-2023  per pt smokes 1/2 ppd,  started age 25  Vaping Use   Vaping status: Never Used  Substance and Sexual Activity   Alcohol use: Not Currently    Comment: 03-09-2023 last alcohol 4-5 months ago   Drug use: Yes    Frequency: 6.0 times per week    Types: Marijuana, Cocaine, Crack cocaine    Comment: 03-09-2023  per pt smokes marijuana at nightly  for bodyaches;  last cocaine one month ago/ crack last week   Sexual activity: Yes    Partners: Female    Comment: DECLINED CONDOMS  Other Topics Concern   Not on file  Social History Narrative   Not on file   Social Drivers of Health   Financial Resource Strain: Not on file  Food Insecurity: Not on file  Transportation Needs: Not on file  Physical Activity: Not on file  Stress: Not on file  Social Connections: Not on file    No Known Allergies   Current Outpatient Medications:    acetaminophen  (TYLENOL ) 500 MG tablet, Take 500 mg by mouth every 6 (six) hours as needed for headache. (Patient not taking: Reported on 07/20/2023), Disp: , Rfl:    amoxicillin -clavulanate (AUGMENTIN ) 875-125 MG tablet, Take 1 tablet by mouth 2 (two) times daily., Disp: 28 tablet, Rfl: 1   atorvastatin  (LIPITOR) 10 MG tablet, Take 1 tablet (10 mg total) by mouth daily., Disp: 30 tablet, Rfl: 11   dolutegravir-lamiVUDine (DOVATO ) 50-300 MG tablet, Take 1 tablet by mouth daily., Disp: 30 tablet, Rfl: 11   sodium fluoride  (SODIUM FLUORIDE  5000 PLUS) 1.1 % CREA dental cream, Use a pea-size amount of toothpaste and brush at  evening homecare.  Do not rinse., Disp: 51 g, Rfl: 5   traMADol  (ULTRAM ) 50 MG tablet, Take 1 tablet (50 mg total) by mouth every 6 (six) hours as needed. (Patient not taking: Reported  on 07/20/2023), Disp: 15 tablet, Rfl: 0   triamcinolone  ointment (KENALOG ) 0.5 %, Apply 1 Application topically 2 (two) times daily. To the neck (in Spanish) (Patient not taking: Reported on 07/20/2023), Disp: 30 g, Rfl: 2   Review of Systems     Objective:   Physical Exam        Assessment & Plan:

## 2023-09-14 ENCOUNTER — Telehealth: Admitting: Infectious Disease

## 2023-09-14 ENCOUNTER — Other Ambulatory Visit: Payer: Self-pay

## 2023-09-14 DIAGNOSIS — R85610 Atypical squamous cells of undetermined significance on cytologic smear of anus (ASC-US): Secondary | ICD-10-CM | POA: Diagnosis not present

## 2023-09-14 DIAGNOSIS — B20 Human immunodeficiency virus [HIV] disease: Secondary | ICD-10-CM | POA: Diagnosis present

## 2023-09-14 DIAGNOSIS — E785 Hyperlipidemia, unspecified: Secondary | ICD-10-CM

## 2023-09-14 DIAGNOSIS — K029 Dental caries, unspecified: Secondary | ICD-10-CM

## 2023-09-14 DIAGNOSIS — Z7185 Encounter for immunization safety counseling: Secondary | ICD-10-CM

## 2023-09-14 DIAGNOSIS — R21 Rash and other nonspecific skin eruption: Secondary | ICD-10-CM | POA: Diagnosis not present

## 2023-09-14 DIAGNOSIS — N489 Disorder of penis, unspecified: Secondary | ICD-10-CM

## 2023-09-14 DIAGNOSIS — E7800 Pure hypercholesterolemia, unspecified: Secondary | ICD-10-CM | POA: Diagnosis not present

## 2023-09-14 MED ORDER — TRIAMCINOLONE ACETONIDE 0.5 % EX OINT: 1.0000 | TOPICAL_OINTMENT | Freq: Two times a day (BID) | CUTANEOUS | 2 refills | Status: AC

## 2023-09-14 MED ORDER — DOVATO 50-300 MG PO TABS: 1.0000 | ORAL_TABLET | Freq: Every day | ORAL | 11 refills | Status: AC

## 2023-09-14 MED ORDER — ATORVASTATIN CALCIUM 20 MG PO TABS: 20.0000 mg | ORAL_TABLET | Freq: Every day | ORAL | 11 refills | Status: AC

## 2023-10-17 ENCOUNTER — Telehealth: Payer: Self-pay

## 2023-10-17 NOTE — Telephone Encounter (Signed)
 Patient called requesting address of clinic he will be transferring to in [place] .   He says it's with [facility]. He prefers the [place] location.   Provided him the following address:  [facility] [address] [place], [place] [address] P: [phone]  [phone] [employer] [phone])  Duwaine Lowe, BSN, RN

## 2023-11-19 ENCOUNTER — Emergency Department (HOSPITAL_COMMUNITY)
Admission: EM | Admit: 2023-11-19 | Discharge: 2023-11-19 | Disposition: A | Discharge status: Home / Self Care | Attending: Emergency Medicine | Admitting: Emergency Medicine

## 2023-11-19 ENCOUNTER — Emergency Department (HOSPITAL_COMMUNITY)

## 2023-11-19 ENCOUNTER — Encounter (HOSPITAL_COMMUNITY): Payer: Self-pay | Admitting: Emergency Medicine

## 2023-11-19 DIAGNOSIS — R0789 Other chest pain: Secondary | ICD-10-CM | POA: Diagnosis present

## 2023-11-19 LAB — COMPREHENSIVE METABOLIC PANEL WITH GFR
ALT: 20 U/L (ref 0–44)
AST: 19 U/L (ref 15–41)
Albumin: 3.9 g/dL (ref 3.5–5.0)
Alkaline Phosphatase: 59 U/L (ref 38–126)
Anion gap: 9 (ref 5–15)
BUN: 6 mg/dL (ref 6–20)
CO2: 26 mmol/L (ref 22–32)
Calcium: 9.2 mg/dL (ref 8.9–10.3)
Chloride: 104 mmol/L (ref 98–111)
Creatinine, Ser: 1.02 mg/dL (ref 0.61–1.24)
GFR, Estimated: >60 mL/min (ref >60)
Glucose, Bld: 97 mg/dL (ref 70–99)
Potassium: 3.7 mmol/L (ref 3.5–5.1)
Sodium: 139 mmol/L (ref 135–145)
Total Bilirubin: 0.4 mg/dL (ref 0.0–1.2)
Total Protein: 7.1 g/dL (ref 6.5–8.1)

## 2023-11-19 LAB — I-STAT CHEM 8, ED
BUN: 6 mg/dL (ref 6–20)
Calcium, Ion: 1.15 mmol/L (ref 1.15–1.40)
Chloride: 103 mmol/L (ref 98–111)
Creatinine, Ser: 1 mg/dL (ref 0.61–1.24)
Glucose, Bld: 95 mg/dL (ref 70–99)
HCT: 40 % (ref 39.0–52.0)
Hemoglobin: 13.6 g/dL (ref 13.0–17.0)
Potassium: 3.7 mmol/L (ref 3.5–5.1)
Sodium: 141 mmol/L (ref 135–145)
TCO2: 26 mmol/L (ref 22–32)

## 2023-11-19 LAB — CBC WITH DIFFERENTIAL/PLATELET
Abs Immature Granulocytes: 0.01 K/uL (ref 0.00–0.07)
Basophils Absolute: 0.1 K/uL (ref 0.0–0.1)
Basophils Relative: 1 %
Eosinophils Absolute: 0.2 K/uL (ref 0.0–0.5)
Eosinophils Relative: 3 %
HCT: 40.2 % (ref 39.0–52.0)
Hemoglobin: 13 g/dL (ref 13.0–17.0)
Immature Granulocytes: 0 %
Lymphocytes Relative: 39 %
Lymphs Abs: 2.3 K/uL (ref 0.7–4.0)
MCH: 30.1 pg (ref 26.0–34.0)
MCHC: 32.3 g/dL (ref 30.0–36.0)
MCV: 93.1 fL (ref 80.0–100.0)
Monocytes Absolute: 0.5 K/uL (ref 0.1–1.0)
Monocytes Relative: 8 %
Neutro Abs: 3 K/uL (ref 1.7–7.7)
Neutrophils Relative %: 49 %
Platelets: 234 K/uL (ref 150–400)
RBC: 4.32 MIL/uL (ref 4.22–5.81)
RDW: 13.3 % (ref 11.5–15.5)
WBC: 6 K/uL (ref 4.0–10.5)
nRBC: 0 % (ref 0.0–0.2)

## 2023-11-19 MED ORDER — IOHEXOL 350 MG/ML SOLN
75.0000 mL | Freq: Once | INTRAVENOUS | Status: AC | PRN
  Administered 2023-11-19: 75 mL via INTRAVENOUS

## 2023-11-19 NOTE — ED Provider Notes (Signed)
 [place] AT [facility] Provider Note   CSN: [id number] Arrival date & time: 11/19/23  1737     Patient presents with: Rib Injury   Herbert Ellison is a 43 y.o. male.   Patient presents today for right-sided chest wall pain.  This has been ongoing for a week.  Pain is not pleuritic.  Only worse with stretching.  No reproducible on exam.  No shortness of breath.  Denies fever.  The history is provided by the patient. No language interpreter was used.       Prior to Admission medications   Medication Sig Start Date End Date Taking? Authorizing Provider  acetaminophen  (TYLENOL ) 500 MG tablet Take 500 mg by mouth every 6 (six) hours as needed for headache. Patient not taking: Reported on 07/20/2023    [provider]  amoxicillin -clavulanate (AUGMENTIN ) 875-125 MG tablet Take 1 tablet by mouth 2 (two) times daily. 07/20/23   Fleeta Kathie Jomarie LOISE, MD  atorvastatin  (LIPITOR) 20 MG tablet Take 1 tablet (20 mg total) by mouth daily. 09/14/23   Fleeta Kathie Jomarie LOISE, MD  dolutegravir-lamiVUDine (DOVATO ) 50-300 MG tablet Take 1 tablet by mouth daily. 09/14/23   Fleeta Kathie Jomarie LOISE, MD  sodium fluoride  (SODIUM FLUORIDE  5000 PLUS) 1.1 % CREA dental cream Use a pea-size amount of toothpaste and brush at evening homecare.  Do not rinse. 09/08/20   Shim, Margarito DEL, DMD  traMADol  (ULTRAM ) 50 MG tablet Take 1 tablet (50 mg total) by mouth every 6 (six) hours as needed. Patient not taking: Reported on 07/20/2023 06/10/23   Debby Hila, MD  triamcinolone  ointment (KENALOG ) 0.5 % Apply 1 Application topically 2 (two) times daily. To the neck (in Spanish) 09/14/23   Fleeta Kathie, Jomarie LOISE, MD    Allergies: Patient has no known allergies.    Review of Systems  Constitutional:  Negative for chills and fever.  Respiratory:  Negative for shortness of breath.   Cardiovascular:  Positive for chest pain. Negative for palpitations and leg swelling.  Gastrointestinal:   Negative for abdominal pain.  Neurological:  Negative for light-headedness.  All other systems reviewed and are negative.   Updated Vital Signs BP (!) 140/99 (BP Location: Left Arm)   Pulse 65   Temp 98.2 F (36.8 C) (Oral)   Resp 16   SpO2 100%   Physical Exam Vitals and nursing note reviewed.  Constitutional:      General: He is not in acute distress.    Appearance: Normal appearance. He is not ill-appearing.  HENT:     Head: Normocephalic and atraumatic.     Nose: Nose normal.  Eyes:     Conjunctiva/sclera: Conjunctivae normal.  Cardiovascular:     Rate and Rhythm: Normal rate and regular rhythm.  Pulmonary:     Effort: Pulmonary effort is normal. No respiratory distress.  Chest:     Comments: Nodular area on the right side of his chest wall that appears to be like a nodular vessel.  Unsure of what this is. Musculoskeletal:        General: No deformity.     Cervical back: Normal range of motion.  Skin:    Findings: No rash.  Neurological:     Mental Status: He is alert.     (all labs ordered are listed, but only abnormal results are displayed) Labs Reviewed  CBC WITH DIFFERENTIAL/PLATELET  COMPREHENSIVE METABOLIC PANEL WITH GFR  I-STAT CHEM 8, ED    EKG: EKG Interpretation Date/Time:  Saturday November 19 2023 20:08:25 EDT Ventricular Rate:  62 PR Interval:  126 QRS Duration:  92 QT Interval:  402 QTC Calculation: 408 R Axis:   56  Text Interpretation: Normal sinus rhythm Normal ECG When compared with ECG of 18-Feb-2012 19:48, PREVIOUS ECG IS PRESENT Confirmed by Doretha Folks ([id number]) on 11/19/2023 11:06:32 PM  Radiology: CT Angio Chest PE W and/or Wo Contrast Result Date: 11/19/2023 CLINICAL DATA:  Pulmonary embolism (PE) suspected, high prob. Chest pain EXAM: CT ANGIOGRAPHY CHEST WITH CONTRAST TECHNIQUE: Multidetector CT imaging of the chest was performed using the standard protocol during bolus administration of intravenous contrast. Multiplanar CT  image reconstructions and MIPs were obtained to evaluate the vascular anatomy. RADIATION DOSE REDUCTION: This exam was performed according to the departmental dose-optimization program which includes automated exposure control, adjustment of the mA and/or kV according to patient size and/or use of iterative reconstruction technique. CONTRAST:  75mL OMNIPAQUE  IOHEXOL  350 MG/ML SOLN COMPARISON:  Chest x-ray 11/19/2023, chest x-ray 02/18/2012 FINDINGS: Cardiovascular: Satisfactory opacification of the pulmonary arteries to the segmental level. No evidence of pulmonary embolism. Limited evaluation of the subsegmental level. Normal heart size. No significant pericardial effusion. The thoracic aorta is normal in caliber. No atherosclerotic plaque of the thoracic aorta. No coronary artery calcifications. Mediastinum/Nodes: No enlarged mediastinal, hilar, or axillary lymph nodes. Thyroid gland, trachea, and esophagus demonstrate no significant findings. Lungs/Pleura: Left basilar atelectasis. No focal consolidation. Triangular subpleural nodule along the minor fissure likely intrapulmonary lymph node-no further follow-up indicated. No pulmonary mass. No pleural effusion. No pneumothorax. Upper Abdomen: No acute abnormality. Musculoskeletal: No chest wall abnormality. No suspicious lytic or blastic osseous lesions. No acute displaced fracture. Densely sclerotic lesion of the T10 and T12 vertebral bodies. Review of the MIP images confirms the above findings. IMPRESSION: 1. No pulmonary embolus. 2. No acute intrathoracic abnormality. Electronically Signed   By: Morgane  Naveau M.D.   On: 11/19/2023 23:02   DG Ribs Unilateral W/Chest Right Result Date: 11/19/2023 CLINICAL DATA:  Chest pain. EXAM: RIGHT RIBS AND CHEST - 3+ VIEW COMPARISON:  Chest radiograph dated 02/18/2012. FINDINGS: No fracture or other bone lesions are seen involving the ribs. There is no evidence of pneumothorax or pleural effusion. Both lungs are clear.  Heart size and mediastinal contours are within normal limits. IMPRESSION: Negative. Electronically Signed   By: Vanetta Chou M.D.   On: 11/19/2023 19:22     Procedures   Medications Ordered in the ED  iohexol  (OMNIPAQUE ) 350 MG/ML injection 75 mL (75 mLs Intravenous Contrast Given 11/19/23 2225)    Clinical Course as of 11/19/23 2318  Sat Nov 19, 2023  1934 DG Ribs Unilateral W/Chest Right [AA]    Clinical Course User Index [AA] Hildegard Loge, PA-C                                 Medical Decision Making Amount and/or Complexity of Data Reviewed Labs: ordered. Radiology: ordered. Decision-making details documented in ED Course.  Risk Prescription drug management.   Medical Decision Making / ED Course   This patient presents to the ED for concern of chest wall pain, this involves an extensive number of treatment options, and is a complaint that carries with it a high risk of complications and morbidity.  The differential diagnosis includes ACS, PE, pneumonia, MSK etiology  MDM: 43 year old male presents today for concern of right-sided chest wall pain. On exam there is this nodular  region on the right chest wall.  Unsure what this is. Exam is otherwise reassuring. He does have history of HIV. Compliant with his home medicines. Chest x-ray is unremarkable. Reviewed blood work is unremarkable. EKG without acute ischemic change. Will further evaluate with CT angio chest PE study.  This is unremarkable. Patient given PCP follow-up. He will also follow-up with his ID doctor.  Discharged in stable condition.   Lab Tests: -I ordered, reviewed, and interpreted labs.   The pertinent results include:   Labs Reviewed  CBC WITH DIFFERENTIAL/PLATELET  COMPREHENSIVE METABOLIC PANEL WITH GFR  I-STAT CHEM 8, ED      EKG  EKG Interpretation Date/Time:  Saturday November 19 2023 20:08:25 EDT Ventricular Rate:  62 PR Interval:  126 QRS Duration:  92 QT Interval:  402 QTC  Calculation: 408 R Axis:   56  Text Interpretation: Normal sinus rhythm Normal ECG When compared with ECG of 18-Feb-2012 19:48, PREVIOUS ECG IS PRESENT Confirmed by Doretha Folks ([id number]) on 11/19/2023 11:06:32 PM         Imaging Studies ordered: I ordered imaging studies including chest x-ray, CT angio chest PE study I independently visualized and interpreted imaging. I agree with the radiologist interpretation   Medicines ordered and prescription drug management: Meds ordered this encounter  Medications   iohexol  (OMNIPAQUE ) 350 MG/ML injection 75 mL    -I have reviewed the patients home medicines and have made adjustments as needed   Reevaluation: After the interventions noted above, I reevaluated the patient and found that they have :improved  Co morbidities that complicate the patient evaluation  Past Medical History:  Diagnosis Date   Abnormal anal Papanicolaou smear    Dental caries 06/25/2019   Depression    Dizziness 06/21/2022   Headache 06/21/2022   HIV disease (HCC)    Hyperlipidemia 11/12/2021   Insomnia 03/06/2021   Myalgia 11/28/2020   Pap smear of anus with ASCUS 04/13/2023   Polysubstance abuse (HCC)    marijuana/  cocaine/ crack   Positive urine drug screen 04/12/2023   Vaccine counseling 11/24/2022      Dispostion: Discharged in stable condition.  Return precaution discussed.  Patient voices understanding and is in agreement with plan. Final diagnoses:  Right-sided chest wall pain    ED Discharge Orders     None          Hildegard Loge, DEVONNA 11/19/23 2325    Doretha Folks, MD 11/21/23 [phone]

## 2023-11-19 NOTE — ED Notes (Signed)
 Patient transported to X-ray

## 2023-11-19 NOTE — ED Notes (Signed)
Patient verbalizes understanding of discharge instructions. Opportunity for questioning and answers were provided. Armband removed by staff, pt discharged from ED. Ambulated out to lobby  

## 2023-11-19 NOTE — ED Notes (Signed)
 Patient transported to CT

## 2023-11-19 NOTE — Discharge Instructions (Addendum)
 Follow-up with a primary care doctor.  Blood work, CT scan did not show any concerning findings.  Return for any emergent symptoms. Take ibuprofen  600 mg 3 times a day for the next 5 days until you follow-up with your infectious disease doctor or establish a primary care doctor.
